# Patient Record
Sex: Female | Born: 1935 | Race: White | Hispanic: No | Smoking: Former smoker
Health system: Southern US, Community
[De-identification: ages and names within clinical notes are randomized; demographics above are authoritative.]

## PROBLEM LIST (undated history)

## (undated) DIAGNOSIS — I2510 Atherosclerotic heart disease of native coronary artery without angina pectoris: Secondary | ICD-10-CM

## (undated) DIAGNOSIS — R413 Other amnesia: Secondary | ICD-10-CM

## (undated) DIAGNOSIS — I10 Essential (primary) hypertension: Secondary | ICD-10-CM

## (undated) DIAGNOSIS — E785 Hyperlipidemia, unspecified: Secondary | ICD-10-CM

## (undated) DIAGNOSIS — J449 Chronic obstructive pulmonary disease, unspecified: Secondary | ICD-10-CM

## (undated) DIAGNOSIS — R0902 Hypoxemia: Secondary | ICD-10-CM

## (undated) DIAGNOSIS — E119 Type 2 diabetes mellitus without complications: Secondary | ICD-10-CM

## (undated) DIAGNOSIS — Z9181 History of falling: Secondary | ICD-10-CM

## (undated) HISTORY — DX: Atherosclerotic heart disease of native coronary artery without angina pectoris: I25.10

## (undated) HISTORY — PX: CATARACT EXTRACTION, BILATERAL: SHX1313

## (undated) HISTORY — DX: Type 2 diabetes mellitus without complications: E11.9

## (undated) HISTORY — PX: TONSILLECTOMY: SUR1361

## (undated) HISTORY — PX: TOTAL VAGINAL HYSTERECTOMY: SHX2548

## (undated) HISTORY — DX: Other amnesia: R41.3

## (undated) HISTORY — DX: Hypoxemia: R09.02

## (undated) HISTORY — DX: Hyperlipidemia, unspecified: E78.5

## (undated) HISTORY — DX: History of falling: Z91.81

## (undated) HISTORY — PX: IMPLANTABLE CONTACT LENS IMPLANTATION: SHX1792

## (undated) HISTORY — DX: Essential (primary) hypertension: I10

## (undated) HISTORY — PX: GALLBLADDER SURGERY: SHX652

---

## 1996-04-30 HISTORY — PX: CORONARY ARTERY BYPASS GRAFT: SHX141

## 2001-10-07 ENCOUNTER — Ambulatory Visit (HOSPITAL_COMMUNITY): Admission: RE | Admit: 2001-10-07 | Discharge: 2001-10-07 | Payer: Self-pay | Admitting: Internal Medicine

## 2001-11-17 ENCOUNTER — Encounter: Payer: Self-pay | Admitting: Surgery

## 2001-11-17 ENCOUNTER — Encounter: Admission: RE | Admit: 2001-11-17 | Discharge: 2001-11-17 | Payer: Self-pay | Admitting: Surgery

## 2002-04-10 ENCOUNTER — Encounter: Payer: Self-pay | Admitting: Internal Medicine

## 2002-04-10 ENCOUNTER — Encounter: Admission: RE | Admit: 2002-04-10 | Discharge: 2002-04-10 | Payer: Self-pay | Admitting: Internal Medicine

## 2002-04-14 ENCOUNTER — Encounter: Admission: RE | Admit: 2002-04-14 | Discharge: 2002-04-14 | Payer: Self-pay | Admitting: Internal Medicine

## 2002-04-14 ENCOUNTER — Encounter: Payer: Self-pay | Admitting: Internal Medicine

## 2004-01-27 ENCOUNTER — Ambulatory Visit (HOSPITAL_COMMUNITY): Admission: RE | Admit: 2004-01-27 | Discharge: 2004-01-27 | Payer: Self-pay

## 2004-02-28 ENCOUNTER — Ambulatory Visit: Payer: Self-pay | Admitting: Internal Medicine

## 2004-02-28 ENCOUNTER — Inpatient Hospital Stay (HOSPITAL_COMMUNITY): Admission: EM | Admit: 2004-02-28 | Discharge: 2004-03-08 | Payer: Self-pay | Admitting: Emergency Medicine

## 2004-02-28 ENCOUNTER — Ambulatory Visit: Payer: Self-pay | Admitting: Cardiology

## 2004-02-29 ENCOUNTER — Ambulatory Visit: Payer: Self-pay | Admitting: Internal Medicine

## 2004-03-03 ENCOUNTER — Ambulatory Visit: Payer: Self-pay | Admitting: Internal Medicine

## 2004-03-20 ENCOUNTER — Ambulatory Visit: Payer: Self-pay | Admitting: Internal Medicine

## 2004-04-20 ENCOUNTER — Ambulatory Visit: Payer: Self-pay | Admitting: Internal Medicine

## 2004-06-23 ENCOUNTER — Ambulatory Visit: Payer: Self-pay | Admitting: Internal Medicine

## 2004-09-13 ENCOUNTER — Ambulatory Visit: Payer: Self-pay | Admitting: Unknown Physician Specialty

## 2004-10-25 ENCOUNTER — Ambulatory Visit: Payer: Self-pay

## 2004-10-26 ENCOUNTER — Other Ambulatory Visit: Payer: Self-pay

## 2004-10-26 ENCOUNTER — Inpatient Hospital Stay: Payer: Self-pay

## 2004-10-30 ENCOUNTER — Inpatient Hospital Stay: Payer: Self-pay | Admitting: Psychiatry

## 2005-07-10 ENCOUNTER — Inpatient Hospital Stay: Payer: Self-pay | Admitting: Internal Medicine

## 2005-07-10 ENCOUNTER — Other Ambulatory Visit: Payer: Self-pay

## 2005-12-26 ENCOUNTER — Ambulatory Visit: Payer: Self-pay | Admitting: Family Medicine

## 2006-08-29 ENCOUNTER — Ambulatory Visit: Payer: Self-pay | Admitting: Unknown Physician Specialty

## 2006-09-11 ENCOUNTER — Ambulatory Visit: Payer: Self-pay | Admitting: Unknown Physician Specialty

## 2007-04-16 ENCOUNTER — Other Ambulatory Visit: Payer: Self-pay

## 2007-04-16 ENCOUNTER — Ambulatory Visit: Payer: Self-pay | Admitting: Internal Medicine

## 2007-08-27 ENCOUNTER — Ambulatory Visit: Payer: Self-pay | Admitting: Internal Medicine

## 2007-08-27 ENCOUNTER — Other Ambulatory Visit: Payer: Self-pay

## 2009-02-11 ENCOUNTER — Ambulatory Visit: Payer: Self-pay | Admitting: Internal Medicine

## 2009-02-13 ENCOUNTER — Ambulatory Visit: Payer: Self-pay | Admitting: Internal Medicine

## 2009-02-13 ENCOUNTER — Inpatient Hospital Stay: Payer: Self-pay | Admitting: Internal Medicine

## 2009-02-16 HISTORY — PX: CARDIAC CATHETERIZATION: SHX172

## 2009-02-18 ENCOUNTER — Encounter: Payer: Self-pay | Admitting: Internal Medicine

## 2009-02-28 ENCOUNTER — Encounter: Payer: Self-pay | Admitting: Internal Medicine

## 2009-03-11 ENCOUNTER — Emergency Department: Payer: Self-pay | Admitting: Emergency Medicine

## 2009-03-12 ENCOUNTER — Inpatient Hospital Stay: Payer: Self-pay | Admitting: Internal Medicine

## 2009-03-16 ENCOUNTER — Ambulatory Visit: Payer: Self-pay | Admitting: Internal Medicine

## 2009-03-17 ENCOUNTER — Ambulatory Visit: Payer: Self-pay | Admitting: Family Medicine

## 2009-05-02 ENCOUNTER — Emergency Department: Payer: Self-pay | Admitting: Emergency Medicine

## 2009-05-16 ENCOUNTER — Emergency Department: Payer: Self-pay | Admitting: Internal Medicine

## 2009-09-05 ENCOUNTER — Ambulatory Visit: Payer: Self-pay | Admitting: Family Medicine

## 2009-09-20 ENCOUNTER — Emergency Department: Payer: Self-pay | Admitting: Emergency Medicine

## 2009-10-23 ENCOUNTER — Ambulatory Visit: Payer: Self-pay | Admitting: Internal Medicine

## 2010-08-19 ENCOUNTER — Emergency Department: Payer: Self-pay | Admitting: Emergency Medicine

## 2011-01-14 IMAGING — CR DG KNEE COMPLETE 4+V*R*
1 series · 5 of 5 positions shown · non-contrast
Comparison: none

REASON FOR EXAM: sprain of knee
COMMENTS:

PROCEDURE:     MDR - MDR KNEE RT COMPLETE W/OBLIQUES  - September 05, 2009  [DATE]
RESULT:     No fracture about the knee joint is seen. The patella is intact.
There is an old healed fracture of the proximal fibular diaphysis.

[Series 1: view not recorded · 0.17mm/px · 5 of 5 slices shown]
[im 1/5]
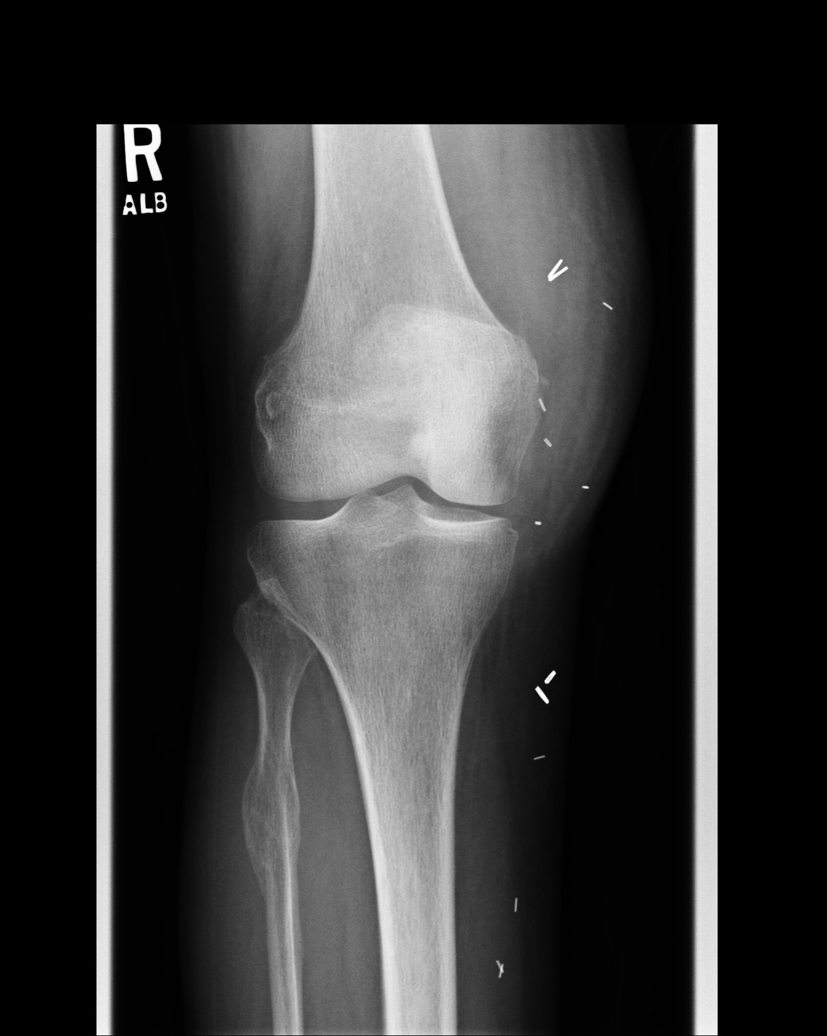
[im 2/5]
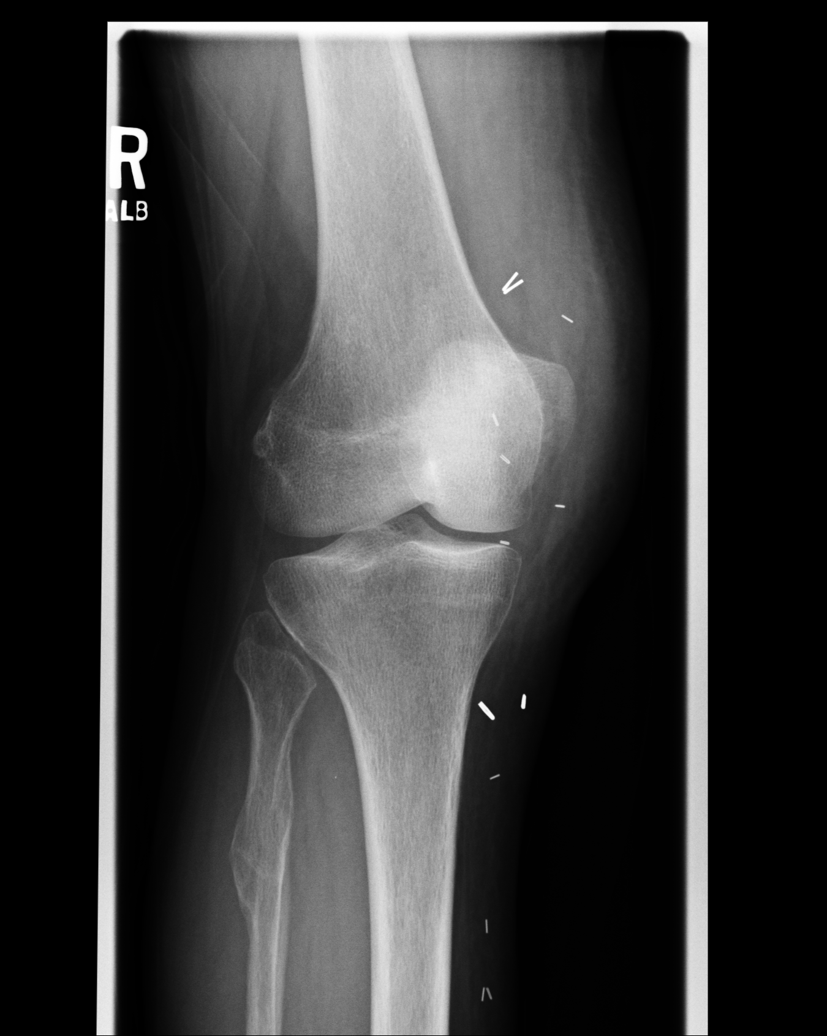
[im 3/5]
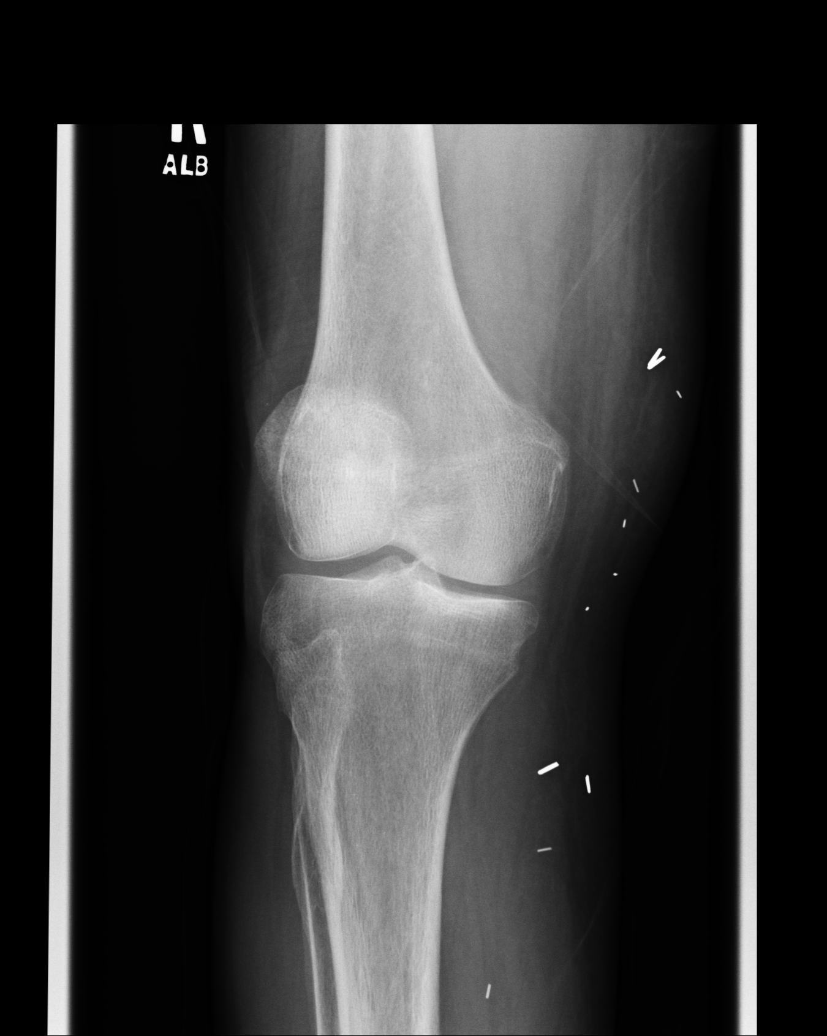
[im 4/5]
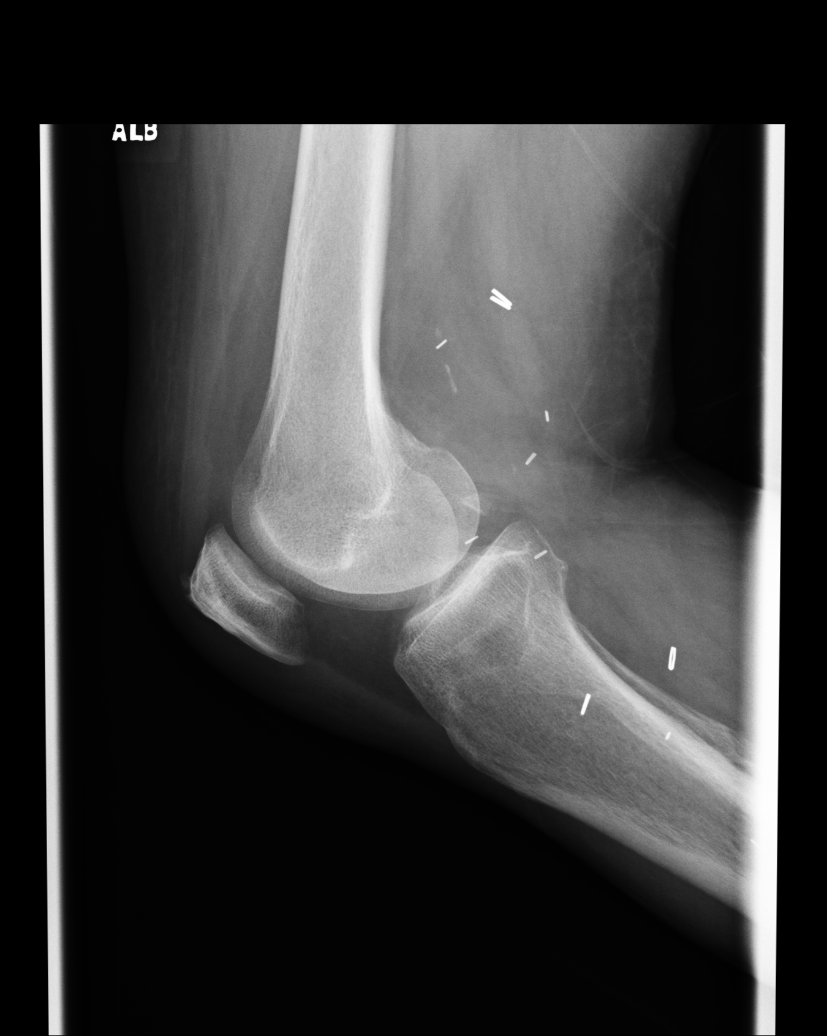
[im 5/5]
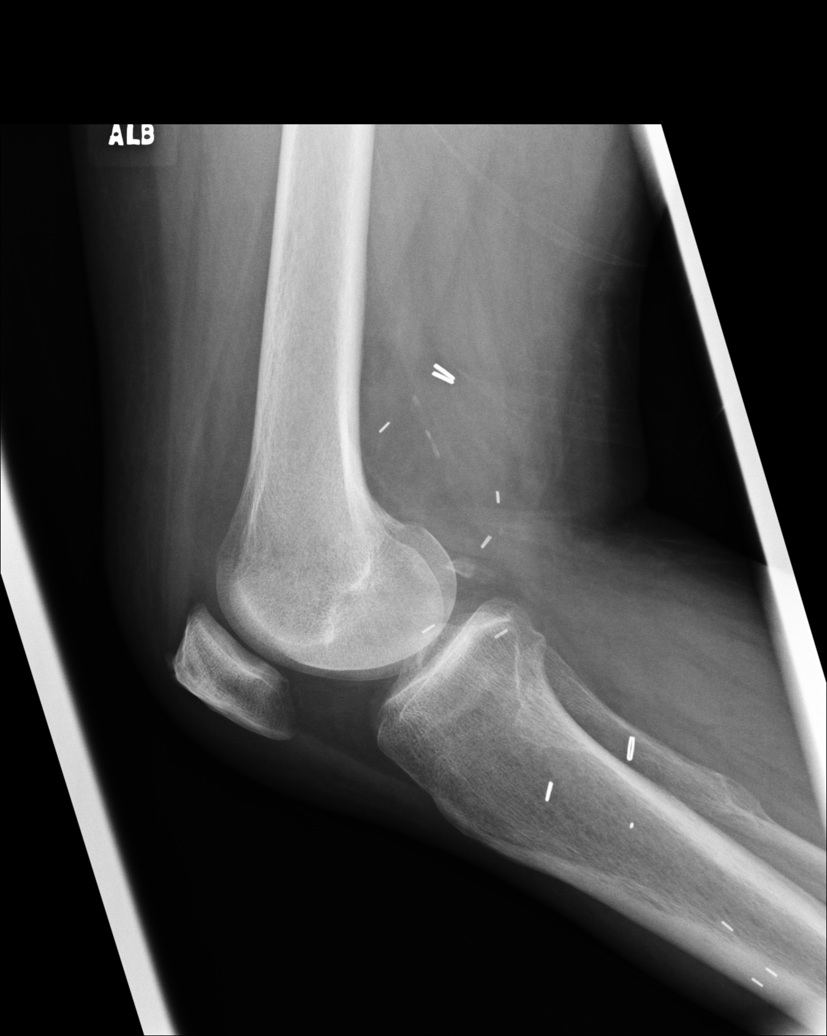

[5 of 5 positions shown; findings below may reference images not displayed]

IMPRESSION: 1. No acute bony abnormalities are seen.
2. There is an old fracture of the proximal fibular diaphysis.

## 2011-06-04 ENCOUNTER — Ambulatory Visit: Payer: Self-pay

## 2011-09-03 ENCOUNTER — Ambulatory Visit: Payer: Self-pay | Admitting: Family Medicine

## 2011-10-03 ENCOUNTER — Observation Stay: Payer: Self-pay | Admitting: Internal Medicine

## 2011-10-03 LAB — URINALYSIS, COMPLETE
Bacteria: NONE SEEN
Blood: NEGATIVE
Leukocyte Esterase: NEGATIVE
Nitrite: NEGATIVE
Ph: 5 (ref 4.5–8.0)
Protein: NEGATIVE
RBC,UR: 1 /HPF (ref 0–5)
Specific Gravity: 1.025 (ref 1.003–1.030)
Squamous Epithelial: 1
WBC UR: 5 /HPF (ref 0–5)

## 2011-10-03 LAB — COMPREHENSIVE METABOLIC PANEL
Albumin: 3.6 g/dL (ref 3.4–5.0)
Alkaline Phosphatase: 85 U/L (ref 50–136)
BUN: 23 mg/dL — ABNORMAL HIGH (ref 7–18)
Bilirubin,Total: 0.4 mg/dL (ref 0.2–1.0)
Chloride: 107 mmol/L (ref 98–107)
Co2: 27 mmol/L (ref 21–32)
Creatinine: 0.74 mg/dL (ref 0.60–1.30)
EGFR (African American): >60
EGFR (Non-African Amer.): >60
Glucose: 237 mg/dL — ABNORMAL HIGH (ref 65–99)
Osmolality: 295 (ref 275–301)
Potassium: 4.4 mmol/L (ref 3.5–5.1)
Sodium: 142 mmol/L (ref 136–145)

## 2011-10-03 LAB — CBC
HCT: 44.7 % (ref 35.0–47.0)
HGB: 14.9 g/dL (ref 12.0–16.0)
MCH: 30.1 pg (ref 26.0–34.0)
MCHC: 33.5 g/dL (ref 32.0–36.0)
MCV: 90 fL (ref 80–100)
Platelet: 126 10*3/uL — ABNORMAL LOW (ref 150–440)
RBC: 4.96 10*6/uL (ref 3.80–5.20)
RDW: 14.1 % (ref 11.5–14.5)
WBC: 7.5 10*3/uL (ref 3.6–11.0)

## 2011-10-03 LAB — TSH: Thyroid Stimulating Horm: 0.692 u[IU]/mL

## 2011-10-04 DIAGNOSIS — I359 Nonrheumatic aortic valve disorder, unspecified: Secondary | ICD-10-CM

## 2011-10-04 LAB — CBC WITH DIFFERENTIAL/PLATELET
Basophil #: 0 10*3/uL (ref 0.0–0.1)
Basophil %: 0.5 %
Eosinophil #: 0.1 10*3/uL (ref 0.0–0.7)
Eosinophil %: 1.1 %
HCT: 44.4 % (ref 35.0–47.0)
HGB: 14.7 g/dL (ref 12.0–16.0)
Lymphocyte #: 3.5 10*3/uL (ref 1.0–3.6)
Lymphocyte %: 49.9 %
MCH: 29.5 pg (ref 26.0–34.0)
MCHC: 33.1 g/dL (ref 32.0–36.0)
MCV: 89 fL (ref 80–100)
Monocyte #: 0.5 x10 3/mm (ref 0.2–0.9)
Monocyte %: 6.4 %
Neutrophil #: 3 10*3/uL (ref 1.4–6.5)
Neutrophil %: 42.1 %
Platelet: 127 10*3/uL — ABNORMAL LOW (ref 150–440)
RBC: 4.97 10*6/uL (ref 3.80–5.20)
RDW: 14 % (ref 11.5–14.5)
WBC: 7 10*3/uL (ref 3.6–11.0)

## 2011-10-04 LAB — BASIC METABOLIC PANEL
Anion Gap: 8 (ref 7–16)
BUN: 17 mg/dL (ref 7–18)
Calcium, Total: 9.1 mg/dL (ref 8.5–10.1)
Chloride: 108 mmol/L — ABNORMAL HIGH (ref 98–107)
Co2: 26 mmol/L (ref 21–32)
Creatinine: 0.49 mg/dL — ABNORMAL LOW (ref 0.60–1.30)
EGFR (African American): >60
EGFR (Non-African Amer.): >60
Glucose: 184 mg/dL — ABNORMAL HIGH (ref 65–99)
Osmolality: 289 (ref 275–301)
Potassium: 3.9 mmol/L (ref 3.5–5.1)
Sodium: 142 mmol/L (ref 136–145)

## 2011-10-04 LAB — LIPID PANEL
Cholesterol: 150 mg/dL (ref 0–200)
HDL Cholesterol: 32 mg/dL — ABNORMAL LOW (ref 40–60)
Ldl Cholesterol, Calc: 52 mg/dL (ref 0–100)
Triglycerides: 331 mg/dL — ABNORMAL HIGH (ref 0–200)
VLDL Cholesterol, Calc: 66 mg/dL — ABNORMAL HIGH (ref 5–40)

## 2011-10-04 LAB — HEMOGLOBIN A1C: Hemoglobin A1C: 10 % — ABNORMAL HIGH (ref 4.2–6.3)

## 2011-10-24 ENCOUNTER — Ambulatory Visit: Payer: Self-pay | Admitting: Unknown Physician Specialty

## 2011-11-25 ENCOUNTER — Ambulatory Visit: Payer: Self-pay | Admitting: Internal Medicine

## 2012-10-30 ENCOUNTER — Emergency Department: Payer: Self-pay

## 2012-10-30 LAB — CBC WITH DIFFERENTIAL/PLATELET
Basophil #: 0.1 10*3/uL (ref 0.0–0.1)
Basophil %: 0.8 %
Eosinophil #: 0.1 10*3/uL (ref 0.0–0.7)
Eosinophil %: 1 %
HCT: 44.3 % (ref 35.0–47.0)
HGB: 14.9 g/dL (ref 12.0–16.0)
Lymphocyte #: 3.3 10*3/uL (ref 1.0–3.6)
Lymphocyte %: 36.6 %
MCH: 29.5 pg (ref 26.0–34.0)
MCHC: 33.6 g/dL (ref 32.0–36.0)
MCV: 88 fL (ref 80–100)
Monocyte #: 0.5 x10 3/mm (ref 0.2–0.9)
Monocyte %: 5.9 %
Neutrophil #: 5 10*3/uL (ref 1.4–6.5)
Neutrophil %: 55.7 %
Platelet: 175 10*3/uL (ref 150–440)
RBC: 5.04 10*6/uL (ref 3.80–5.20)
RDW: 15.7 % — ABNORMAL HIGH (ref 11.5–14.5)
WBC: 8.9 10*3/uL (ref 3.6–11.0)

## 2012-10-30 LAB — BASIC METABOLIC PANEL
Anion Gap: 5 — ABNORMAL LOW (ref 7–16)
BUN: 15 mg/dL (ref 7–18)
Calcium, Total: 10.2 mg/dL — ABNORMAL HIGH (ref 8.5–10.1)
Chloride: 108 mmol/L — ABNORMAL HIGH (ref 98–107)
Co2: 30 mmol/L (ref 21–32)
Creatinine: 0.65 mg/dL (ref 0.60–1.30)
EGFR (African American): >60
EGFR (Non-African Amer.): >60
Glucose: 72 mg/dL (ref 65–99)
Osmolality: 284 (ref 275–301)
Potassium: 3.9 mmol/L (ref 3.5–5.1)
Sodium: 143 mmol/L (ref 136–145)

## 2013-01-16 ENCOUNTER — Ambulatory Visit: Payer: Self-pay | Admitting: Internal Medicine

## 2013-01-16 LAB — URINALYSIS, COMPLETE
Blood: NEGATIVE
Glucose,UR: 100 mg/dL (ref 0–75)
Ketone: NEGATIVE
Nitrite: POSITIVE
Ph: 6 (ref 4.5–8.0)
Specific Gravity: 1.02 (ref 1.003–1.030)

## 2013-01-18 ENCOUNTER — Ambulatory Visit: Payer: Self-pay | Admitting: Family Medicine

## 2013-01-18 LAB — URINE CULTURE

## 2013-05-16 ENCOUNTER — Emergency Department: Payer: Self-pay | Admitting: Emergency Medicine

## 2013-05-18 ENCOUNTER — Ambulatory Visit: Payer: Self-pay | Admitting: Family Medicine

## 2013-05-18 LAB — COMPREHENSIVE METABOLIC PANEL
Albumin: 3.6 g/dL (ref 3.4–5.0)
Alkaline Phosphatase: 81 U/L
Anion Gap: 11 (ref 7–16)
BUN: 14 mg/dL (ref 7–18)
Bilirubin,Total: 0.3 mg/dL (ref 0.2–1.0)
Calcium, Total: 9.7 mg/dL (ref 8.5–10.1)
Chloride: 100 mmol/L (ref 98–107)
Co2: 30 mmol/L (ref 21–32)
Creatinine: 0.71 mg/dL (ref 0.60–1.30)
EGFR (African American): >60
EGFR (Non-African Amer.): >60
Glucose: 169 mg/dL — ABNORMAL HIGH (ref 65–99)
Osmolality: 286 (ref 275–301)
Potassium: 3.6 mmol/L (ref 3.5–5.1)
SGOT(AST): 13 U/L — ABNORMAL LOW (ref 15–37)
SGPT (ALT): 20 U/L (ref 12–78)
Sodium: 141 mmol/L (ref 136–145)
Total Protein: 6.3 g/dL — ABNORMAL LOW (ref 6.4–8.2)

## 2013-05-18 LAB — CBC WITH DIFFERENTIAL/PLATELET
Basophil #: 0.1 10*3/uL (ref 0.0–0.1)
Basophil %: 0.8 %
Eosinophil #: 0.1 10*3/uL (ref 0.0–0.7)
Eosinophil %: 1 %
HCT: 41.8 % (ref 35.0–47.0)
HGB: 13.4 g/dL (ref 12.0–16.0)
Lymphocyte #: 2.6 10*3/uL (ref 1.0–3.6)
Lymphocyte %: 29.5 %
MCH: 29.1 pg (ref 26.0–34.0)
MCHC: 32 g/dL (ref 32.0–36.0)
MCV: 91 fL (ref 80–100)
Monocyte #: 0.6 x10 3/mm (ref 0.2–0.9)
Monocyte %: 6.4 %
Neutrophil #: 5.5 10*3/uL (ref 1.4–6.5)
Neutrophil %: 62.3 %
Platelet: 153 10*3/uL (ref 150–440)
RBC: 4.6 10*6/uL (ref 3.80–5.20)
RDW: 14.4 % (ref 11.5–14.5)
WBC: 8.9 10*3/uL (ref 3.6–11.0)

## 2013-05-19 ENCOUNTER — Ambulatory Visit: Payer: Self-pay | Admitting: Family Medicine

## 2013-05-19 LAB — URINALYSIS, COMPLETE
Blood: NEGATIVE
Glucose,UR: 100 mg/dL (ref 0–75)
Ketone: NEGATIVE
Leukocyte Esterase: NEGATIVE
Nitrite: POSITIVE
Ph: 6 (ref 4.5–8.0)
RBC,UR: NONE SEEN /HPF (ref 0–5)
Specific Gravity: 1.025 (ref 1.003–1.030)

## 2013-05-21 ENCOUNTER — Ambulatory Visit: Payer: Self-pay

## 2013-05-21 LAB — URINALYSIS, COMPLETE
Blood: NEGATIVE
GLUCOSE, UR: NEGATIVE mg/dL (ref 0–75)
Ketone: NEGATIVE
NITRITE: NEGATIVE
Ph: 6 (ref 4.5–8.0)
Specific Gravity: >=1.03 (ref 1.003–1.030)

## 2013-05-22 LAB — URINE CULTURE

## 2013-06-11 ENCOUNTER — Encounter: Payer: Self-pay | Admitting: Cardiovascular Disease

## 2013-06-11 ENCOUNTER — Ambulatory Visit (INDEPENDENT_AMBULATORY_CARE_PROVIDER_SITE_OTHER): Payer: Medicare Other | Admitting: Cardiovascular Disease

## 2013-06-11 VITALS — BP 100/52 | HR 77 | Ht 60.0 in | Wt 130.0 lb

## 2013-06-11 DIAGNOSIS — F17200 Nicotine dependence, unspecified, uncomplicated: Secondary | ICD-10-CM

## 2013-06-11 DIAGNOSIS — J438 Other emphysema: Secondary | ICD-10-CM

## 2013-06-11 DIAGNOSIS — M1990 Unspecified osteoarthritis, unspecified site: Secondary | ICD-10-CM

## 2013-06-11 DIAGNOSIS — R296 Repeated falls: Secondary | ICD-10-CM | POA: Insufficient documentation

## 2013-06-11 DIAGNOSIS — I498 Other specified cardiac arrhythmias: Secondary | ICD-10-CM

## 2013-06-11 DIAGNOSIS — E119 Type 2 diabetes mellitus without complications: Secondary | ICD-10-CM | POA: Insufficient documentation

## 2013-06-11 DIAGNOSIS — R55 Syncope and collapse: Secondary | ICD-10-CM | POA: Insufficient documentation

## 2013-06-11 DIAGNOSIS — I25810 Atherosclerosis of coronary artery bypass graft(s) without angina pectoris: Secondary | ICD-10-CM | POA: Insufficient documentation

## 2013-06-11 DIAGNOSIS — W19XXXA Unspecified fall, initial encounter: Secondary | ICD-10-CM | POA: Insufficient documentation

## 2013-06-11 DIAGNOSIS — J439 Emphysema, unspecified: Secondary | ICD-10-CM | POA: Insufficient documentation

## 2013-06-11 DIAGNOSIS — R001 Bradycardia, unspecified: Secondary | ICD-10-CM | POA: Insufficient documentation

## 2013-06-11 NOTE — Progress Notes (Signed)
Patient ID: Kimberly Rodgers, female    DOB: January 16, 1935, 79 y.o.   MRN: [id number]  HPI Comments: Kimberly Rodgers is a 79 year old woman with history of CAD, bypass surgery x3 in 1998, diabetes, long history of smoking who continues to smoke, cardiac catheterization in October 2010 showing patent grafts with LIMA to the LAD, vein graft to the PDA, vein graft to the OM, with history of syncope. She presents for new patient evaluation for syncope, frequent falls, pass out spells.  Her 2 daughters present with her today. They know her history relatively well. They report a remote history of seizures. They deny any recent seizure activity. No recent cardiac decompensation, no symptoms of angina. Their biggest concern is periods of syncope (" blacking out") that seemed to occur while walking. She has had frequent falls from gait instability. Previously required staples to her head for lacerations.. most recent episode of syncope was in the middle of January 2015, witnessed by her daughter. She was walking to the back of the house, up on a porch when she acutely passed out and hit her head. She had a short recovery time. Patient does not remember the event.  Previous events she does not remember as well. Sometimes she has taken several minutes to recover. Recent blood pressure measurements with primary-care have shown orthostasis with systolic pressures of 100 with standing. Also documentation of low heart rates with standing with heart rate in the high 40s. Typically heart rate is in the 60s to 70s it would appear by the office notes.  Hemoglobin A1c down from 6.5-5.8. She has been losing weight, not drinking much, not eating much. Does report recent urinary tract infection which was treated. She had confusion during this time. They report UTIs better her confusion has persisted to a mild degree. She has seen neurology for confusion, started on Aricept. She is essentially wheelchair-bound though does walk with  assistance In the past she has refused oxygen. Documentation of frequent periods of hypoxia with sats into the low 80s. She has been more compliant with her oxygen recently. Previous falls also be associated with low oxygen levels she was wearing oxygen for recent syncopal episode  Prior echocardiogram October 2010 with ejection fraction 45%. LVH otherwise normal study Carotid ultrasound June 2013 with mild carotid disease  EKG shows normal sinus rhythm with rate 77 beats per minute, no significant ST or T wave changes   Outpatient Encounter Prescriptions as of 06/11/2013  Medication Sig  . aspirin 81 MG tablet Take 81 mg by mouth daily.  Marland Kitchen atorvastatin (LIPITOR) 40 MG tablet Take 40 mg by mouth daily at 6 PM.   . donepezil (ARICEPT) 5 MG tablet Take 5 mg by mouth at bedtime.   . furosemide (LASIX) 20 MG tablet Take 20 mg by mouth daily as needed.   Marland Kitchen glipiZIDE (GLUCOTROL) 5 MG tablet Take 5 mg by mouth 2 (two) times daily before a meal.   . metFORMIN (GLUCOPHAGE) 1000 MG tablet Take 1,000 mg by mouth 2 (two) times daily with a meal.   . Naproxen Sodium (ALEVE) 220 MG CAPS Take 220 mg by mouth as needed.  . traZODone (DESYREL) 50 MG tablet Take 100 mg by mouth at bedtime.   Marland Kitchen venlafaxine XR (EFFEXOR-XR) 37.5 MG 24 hr capsule Take 37.5 mg by mouth daily with breakfast.     Review of Systems  Constitutional: Negative.   HENT: Negative.   Eyes: Negative.   Respiratory: Positive for shortness of breath.  Cardiovascular: Negative.   Gastrointestinal: Negative.   Endocrine: Negative.   Musculoskeletal: Positive for gait problem.  Skin: Negative.   Allergic/Immunologic: Negative.   Neurological: Positive for syncope.  Hematological: Negative.   Psychiatric/Behavioral: Positive for confusion.  All other systems reviewed and are negative.    BP 100/52  Pulse 77  Ht 5' (1.524 m)  Wt 130 lb (58.968 kg)  BMI 25.39 kg/m2  Physical Exam  Nursing note and vitals  reviewed. Constitutional: She is oriented to person, place, and time. She appears well-developed and well-nourished.  HENT:  Head: Normocephalic.  Nose: Nose normal.  Mouth/Throat: Oropharynx is clear and moist.  Eyes: Conjunctivae are normal. Pupils are equal, round, and reactive to light.  Neck: Normal range of motion. Neck supple. No JVD present.  Cardiovascular: Normal rate, regular rhythm, S1 normal, S2 normal, normal heart sounds and intact distal pulses.  Exam reveals no gallop and no friction rub.   No murmur heard. Pulmonary/Chest: Effort normal and breath sounds normal. No respiratory distress. She has no wheezes. She has no rales. She exhibits no tenderness.  Abdominal: Soft. Bowel sounds are normal. She exhibits no distension. There is no tenderness.  Musculoskeletal: Normal range of motion. She exhibits no edema and no tenderness.  Lymphadenopathy:    She has no cervical adenopathy.  Neurological: She is alert and oriented to person, place, and time. Coordination normal.  Skin: Skin is warm and dry. No rash noted. No erythema.  Psychiatric: She has a normal mood and affect. Her behavior is normal. Judgment and thought content normal.    Assessment and Plan

## 2013-06-11 NOTE — Assessment & Plan Note (Signed)
She has severe COPD, on oxygen with hypoxia on room air

## 2013-06-11 NOTE — Assessment & Plan Note (Signed)
Recent weight loss. Hemoglobin A1c well controlled

## 2013-06-11 NOTE — Assessment & Plan Note (Signed)
Etiology of her syncopal episodes and they're concerning for arrhythmia. They have been very sporadically, not on a regular basis. As they are not frequent, we will order a 30 day monitor. Severe bradycardia and recorded in the notes from primary care with heart rate in the high 40s.  Unable to exclude orthostatic hypotension. We will hold amlodipine

## 2013-06-11 NOTE — Assessment & Plan Note (Signed)
Very unsteady gait. Family reports physical therapy has been ordered.

## 2013-06-11 NOTE — Patient Instructions (Addendum)
Please stop the amlodipine.  Blood pressure is low  Use voltaren cream periodically (twice a day) for knee pain We will order an event monitor for syncope, falls  Please call us if you have new issues that need to be addressed before your next appt.

## 2013-06-11 NOTE — Assessment & Plan Note (Signed)
She reports that she smokes 3 cigarettes per day, down from 2 or 3 packs per day

## 2013-06-11 NOTE — Assessment & Plan Note (Signed)
Currently with no symptoms of angina. No further workup at this time. Continue current medication regimen. 

## 2013-06-17 DIAGNOSIS — R55 Syncope and collapse: Secondary | ICD-10-CM

## 2013-08-01 ENCOUNTER — Observation Stay: Payer: Self-pay | Admitting: Internal Medicine

## 2013-08-01 LAB — COMPREHENSIVE METABOLIC PANEL
Albumin: 3.3 g/dL — ABNORMAL LOW (ref 3.4–5.0)
Alkaline Phosphatase: 66 U/L
Anion Gap: 3 — ABNORMAL LOW (ref 7–16)
BUN: 13 mg/dL (ref 7–18)
Bilirubin,Total: 0.3 mg/dL (ref 0.2–1.0)
Calcium, Total: 9.1 mg/dL (ref 8.5–10.1)
Chloride: 106 mmol/L (ref 98–107)
Co2: 31 mmol/L (ref 21–32)
Creatinine: 0.64 mg/dL (ref 0.60–1.30)
EGFR (Non-African Amer.): >60
Glucose: 148 mg/dL — ABNORMAL HIGH (ref 65–99)
Osmolality: 282 (ref 275–301)
Potassium: 3.6 mmol/L (ref 3.5–5.1)
SGOT(AST): 23 U/L (ref 15–37)
SGPT (ALT): 24 U/L (ref 12–78)
Sodium: 140 mmol/L (ref 136–145)
Total Protein: 6.2 g/dL — ABNORMAL LOW (ref 6.4–8.2)

## 2013-08-01 LAB — URINALYSIS, COMPLETE
BACTERIA: NONE SEEN
Bilirubin,UR: NEGATIVE
Glucose,UR: 150 mg/dL (ref 0–75)
Ketone: NEGATIVE
Leukocyte Esterase: NEGATIVE
Nitrite: NEGATIVE
Ph: 6 (ref 4.5–8.0)
Protein: NEGATIVE
RBC,UR: NONE SEEN /HPF (ref 0–5)
Specific Gravity: 1.008 (ref 1.003–1.030)
Squamous Epithelial: <1
WBC UR: <1 /HPF (ref 0–5)

## 2013-08-01 LAB — CBC
HCT: 37.3 % (ref 35.0–47.0)
HGB: 12.1 g/dL (ref 12.0–16.0)
MCH: 29 pg (ref 26.0–34.0)
MCHC: 32.4 g/dL (ref 32.0–36.0)
MCV: 90 fL (ref 80–100)
Platelet: 151 10*3/uL (ref 150–440)
RBC: 4.16 10*6/uL (ref 3.80–5.20)
RDW: 14.2 % (ref 11.5–14.5)
WBC: 8 10*3/uL (ref 3.6–11.0)

## 2013-08-01 LAB — TROPONIN I
TROPONIN-I: 0.06 ng/mL — AB
TROPONIN-I: 0.04 ng/mL
Troponin-I: 0.04 ng/mL

## 2013-08-07 ENCOUNTER — Telehealth: Payer: Self-pay

## 2013-08-07 NOTE — Telephone Encounter (Signed)
Left message for pt to call back regarding holter monitor results: "NSR w/ rare APCs".

## 2013-08-07 NOTE — Telephone Encounter (Signed)
Reviewed results w/ pt's daughter, Lupita LeashDonna.

## 2013-08-10 ENCOUNTER — Other Ambulatory Visit: Payer: Self-pay

## 2013-08-10 ENCOUNTER — Ambulatory Visit (INDEPENDENT_AMBULATORY_CARE_PROVIDER_SITE_OTHER): Payer: Medicare Other

## 2013-08-10 DIAGNOSIS — W19XXXA Unspecified fall, initial encounter: Secondary | ICD-10-CM

## 2013-08-10 DIAGNOSIS — R001 Bradycardia, unspecified: Secondary | ICD-10-CM

## 2013-08-10 DIAGNOSIS — R55 Syncope and collapse: Secondary | ICD-10-CM

## 2013-08-10 DIAGNOSIS — I25810 Atherosclerosis of coronary artery bypass graft(s) without angina pectoris: Secondary | ICD-10-CM

## 2013-08-17 ENCOUNTER — Emergency Department: Payer: Self-pay | Admitting: Emergency Medicine

## 2013-08-17 LAB — CBC WITH DIFFERENTIAL/PLATELET
Basophil #: 0 10*3/uL (ref 0.0–0.1)
Basophil %: 0.4 %
Eosinophil #: 0.1 10*3/uL (ref 0.0–0.7)
Eosinophil %: 0.9 %
HCT: 41.3 % (ref 35.0–47.0)
HGB: 13.3 g/dL (ref 12.0–16.0)
Lymphocyte #: 2.6 10*3/uL (ref 1.0–3.6)
Lymphocyte %: 31.5 %
MCH: 28.6 pg (ref 26.0–34.0)
MCHC: 32.2 g/dL (ref 32.0–36.0)
MCV: 89 fL (ref 80–100)
Monocyte #: 0.6 x10 3/mm (ref 0.2–0.9)
Monocyte %: 7.4 %
Neutrophil #: 4.9 10*3/uL (ref 1.4–6.5)
Neutrophil %: 59.8 %
Platelet: 179 10*3/uL (ref 150–440)
RBC: 4.66 10*6/uL (ref 3.80–5.20)
RDW: 14.8 % — ABNORMAL HIGH (ref 11.5–14.5)
WBC: 8.3 10*3/uL (ref 3.6–11.0)

## 2013-08-17 LAB — BASIC METABOLIC PANEL
Anion Gap: 6 — ABNORMAL LOW (ref 7–16)
BUN: 15 mg/dL (ref 7–18)
CHLORIDE: 106 mmol/L (ref 98–107)
CO2: 28 mmol/L (ref 21–32)
Calcium, Total: 9.8 mg/dL (ref 8.5–10.1)
Creatinine: 0.55 mg/dL — ABNORMAL LOW (ref 0.60–1.30)
EGFR (African American): >60
EGFR (Non-African Amer.): >60
Glucose: 93 mg/dL (ref 65–99)
Osmolality: 280 (ref 275–301)
POTASSIUM: 3.6 mmol/L (ref 3.5–5.1)
Sodium: 140 mmol/L (ref 136–145)

## 2013-08-17 LAB — TROPONIN I: Troponin-I: <0.02 ng/mL

## 2013-08-18 LAB — URINALYSIS, COMPLETE
Glucose,UR: NEGATIVE mg/dL (ref 0–75)
Nitrite: NEGATIVE
Ph: 5 (ref 4.5–8.0)
Protein: 30
RBC,UR: 184 /HPF (ref 0–5)
Specific Gravity: 1.03 (ref 1.003–1.030)
Squamous Epithelial: NONE SEEN
WBC UR: 457 /HPF (ref 0–5)

## 2013-09-18 ENCOUNTER — Emergency Department: Payer: Self-pay | Admitting: Emergency Medicine

## 2013-09-18 LAB — HEPATIC FUNCTION PANEL A (ARMC)
Albumin: 3.5 g/dL (ref 3.4–5.0)
Alkaline Phosphatase: 96 U/L
Bilirubin, Direct: 0.2 mg/dL (ref 0.00–0.20)
Bilirubin,Total: 0.6 mg/dL (ref 0.2–1.0)
SGOT(AST): 34 U/L (ref 15–37)
SGPT (ALT): 23 U/L (ref 12–78)
TOTAL PROTEIN: 7.3 g/dL (ref 6.4–8.2)

## 2013-09-18 LAB — BASIC METABOLIC PANEL
Anion Gap: 8 (ref 7–16)
BUN: 26 mg/dL — ABNORMAL HIGH (ref 7–18)
CHLORIDE: 103 mmol/L (ref 98–107)
Calcium, Total: 10.6 mg/dL — ABNORMAL HIGH (ref 8.5–10.1)
Co2: 27 mmol/L (ref 21–32)
Creatinine: 0.55 mg/dL — ABNORMAL LOW (ref 0.60–1.30)
EGFR (African American): >60
Glucose: 129 mg/dL — ABNORMAL HIGH (ref 65–99)
OSMOLALITY: 282 (ref 275–301)
POTASSIUM: 3.7 mmol/L (ref 3.5–5.1)
SODIUM: 138 mmol/L (ref 136–145)

## 2013-09-18 LAB — CBC WITH DIFFERENTIAL/PLATELET
Basophil #: 0.1 10*3/uL (ref 0.0–0.1)
Basophil %: 0.8 %
EOS ABS: 0.1 10*3/uL (ref 0.0–0.7)
Eosinophil %: 1 %
HCT: 45.3 % (ref 35.0–47.0)
HGB: 14.2 g/dL (ref 12.0–16.0)
LYMPHS ABS: 3.1 10*3/uL (ref 1.0–3.6)
LYMPHS PCT: 35 %
MCH: 28.3 pg (ref 26.0–34.0)
MCHC: 31.5 g/dL — ABNORMAL LOW (ref 32.0–36.0)
MCV: 90 fL (ref 80–100)
Monocyte #: 0.6 x10 3/mm (ref 0.2–0.9)
Monocyte %: 7.3 %
Neutrophil #: 4.9 10*3/uL (ref 1.4–6.5)
Neutrophil %: 55.9 %
Platelet: 172 10*3/uL (ref 150–440)
RBC: 5.03 10*6/uL (ref 3.80–5.20)
RDW: 14.1 % (ref 11.5–14.5)
WBC: 8.8 10*3/uL (ref 3.6–11.0)

## 2013-09-18 LAB — URINALYSIS, COMPLETE
BILIRUBIN, UR: NEGATIVE
BLOOD: NEGATIVE
Bacteria: NONE SEEN
Glucose,UR: NEGATIVE mg/dL (ref 0–75)
Leukocyte Esterase: NEGATIVE
Nitrite: NEGATIVE
Ph: 5 (ref 4.5–8.0)
Protein: NEGATIVE
RBC,UR: 2 /HPF (ref 0–5)
SPECIFIC GRAVITY: 1.027 (ref 1.003–1.030)
SQUAMOUS EPITHELIAL: NONE SEEN

## 2013-09-18 LAB — TROPONIN I

## 2013-09-18 LAB — TSH: Thyroid Stimulating Horm: 0.463 u[IU]/mL

## 2014-08-21 NOTE — H&P (Signed)
PATIENT NAME:  Kimberly Rodgers, HALBERG MR#:  [id number] DATE OF BIRTH:  05-21-1934  DATE OF ADMISSION:  08/01/2013  PRIMARY CARE PHYSICIAN: Dr. Rolm Gala.   CHIEF COMPLAINT: Weakness, lethargy and altered mental status.   HISTORY OF PRESENT ILLNESS: This is a 80 year old female who lives with her daughter, was brought into the hospital as her daughter noticed that she was more lethargic and altered as usual. The patient's daughter checked her mother's blood sugar just last night and it was low, in the 40s. She gave her mother some juice. Her blood sugar did come up. This Kimberly Rodgers when her mother was more lethargic and weak, she checked her blood sugar again and it was in the 40s again. The patient, as per the daughter, has not had episodes of hypoglycemia in the past. She has had no acute changes to her diabetic meds made recently. Her appetite has been fair. Because of the hypoglycemic being persistent, she brought the patient to the ER for further evaluation. The patient here, the blood sugars have been stable, although she was also noted to have incidentally elevated troponin at 0.06. The patient actually denied any chest pain, shortness of breath or any other acute symptoms. Hospitalist services were contacted for further treatment and evaluation.   REVIEW OF SYSTEMS:  CONSTITUTIONAL: No documented fever. No weight gain. No weight loss.  EYES: No blurry or double vision.  ENT: No tinnitus. No postnasal drip. No redness of the oropharynx. RESPIRATORY: No cough. No wheeze. No hemoptysis. No dyspnea.  CARDIOVASCULAR: No chest pain. No orthopnea. No palpitations or syncope.  GASTROINTESTINAL: No nausea. No vomiting. No diarrhea.  No abdominal pain. No melena or hematochezia. GENITOURINARY:  No dysuria or hematuria.  ENDOCRINE: No polyuria or nocturia, heat or cold intolerance.  HEMATOLOGIC: No anemia. No bruising. No bleeding.  INTEGUMENTARY: No rashes. No lesions.  MUSCULOSKELETAL: No arthritis. No  swelling. No gout.  NEUROLOGIC: No numbness or tingling. No ataxia. No seizure-type seizure. PSYCHIATRIC: No anxiety. No insomnia. No ADD. Positive dementia and Parkinson's.   ALLERGIES: ASPIRIN, CLINDAMYCIN, CODEINE, DOXYCYCLINE, PENICILLIN AND SULFA DRUGS.   SOCIAL HISTORY: Does have a long history of tobacco abuse, about 40 to 50 years. No alcohol abuse. No illicit drug abuse. Lives at home with her daughter.   FAMILY HISTORY: Both mother and father are deceased. Mother died from cancer in the kidneys. Father died from lung cancer and alcohol abuse.   CURRENT MEDICATIONS: As follows: Aleve 220 mg daily, aspirin 81 mg daily, atorvastatin 40 mg daily, Aricept 5 mg at bedtime, glipizide XL 5 mg b.i.d., metformin 1000 mg b.i.d., Sinemet 25/100 one tab t.i.d., trazodone 100 mg at bedtime, Effexor extended release 37.5 mg daily.   PHYSICAL EXAMINATION: Presently is as follows: VITAL SIGNS:  Temperature is 98.2, pulse 74, respirations 20, blood pressure 123/61, sats 97% on room air.  GENERAL: She is a pleasant-appearing female in no apparent distress.  HEAD, EYES, EARS, NOSE AND THROAT: Atraumatic, normocephalic. Extraocular muscles are intact. Pupils are equal and reactive to light. Sclerae anicteric. No conjunctival injection. No pharyngeal erythema.  NECK: Supple. No jugular venous distention. No bruits. No lymphadenopathy or thyromegaly.  HEART: Regular rate and rhythm. No murmurs. No rubs. No clicks.  LUNGS: Clear to auscultation bilaterally. No rales, rhonchi. No wheezes.  ABDOMEN: Soft, flat, nontender, nondistended. Has good bowel sounds. No hepatosplenomegaly appreciated.  EXTREMITIES: No evidence of any cyanosis, clubbing or peripheral edema. Has +2 pedal and radial pulses bilaterally.  NEUROLOGICAL: The patient is  alert, awake, oriented x 3. No focal motor or sensory deficits appreciated bilaterally. Globally weak.  SKIN: Moist and warm with no rashes.  LYMPHATIC: There is no cervical,  axillary lymphadenopathy.   LABORATORY DATA: Serum glucose of 148, BUN 13, creatinine 0.6. Sodium 140, potassium 3.6, chloride 106, bicarb 31. LFTs within normal limits. Troponin 0.06. White cell count 8, hemoglobin 12.1, hematocrit 37.3, platelet count 151. Urinalysis within normal limits.   The patient did have a CT of the head done which showed no acute intracranial abnormality. The patient also had a chest x-ray done which showed no evidence of any acute cardiopulmonary disease. Bilateral multiple old rib fractures.   ASSESSMENT AND PLAN: This is a 80 year old female with past medical history of dementia with Parkinson disease, hypertension, diabetes, hyperlipidemia, coronary disease, status post bypass, anxiety, who presents to the hospital due to lethargy, weakness  and altered mental status and noted to be hypoglycemic, also noted to have a mildly elevated troponin.  PROBLEM: 1.  Altered mental status/weakness. This is likely due to hypoglycemia and also underlying dementia and deconditioning. The patient's CT head does not show any evidence of any acute abnormality. Her blood sugars have improved and we will continue to monitor her mental status. There is no acute infectious source. I will get a physical therapy consult to assess her mobility. 2.  Elevated troponin. The patient acutely has no chest pain and no acute EKG changes. I will observe her on off unit telemetry, follow serial cardiac markers, continue aspirin and statin for now. 3.  Diabetes with episodes of hypoglycemia. Hold metformin and glipizide, place her on sliding scale insulin for now. Continue carbohydrate-controlled diet. Her diabetic medications may need to be adjusted prior to discharge.  4.  Hyperlipidemia. Continue atorvastatin. 5.  Anxiety. Continue Effexor. 6.  Dementia with Parkinson disease. Continue Aricept and Sinemet.   CODE STATUS: The patient is a full code.   TIME SPENT: 50 minutes.     ____________________________ Rolly PancakeVivek J. Cherlynn KaiserSainani, MD vjs:ce D: 08/01/2013 13:34:12 ET T: 08/01/2013 15:25:03 ET JOB#: [id number]  cc: Rolly PancakeVivek J. Cherlynn KaiserSainani, MD, <Dictator> Houston SirenVIVEK J Zelda Reames MD ELECTRONICALLY SIGNED 08/09/2013 18:45

## 2014-08-21 NOTE — Discharge Summary (Signed)
PATIENT NAME:  Kimberly ManuelFOWLER, Ailynn B MR#:  [id number] DATE OF BIRTH:  1934-06-21  DATE OF ADMISSION:  08/01/2013 DATE OF DISCHARGE:  08/02/2013  ADMISSION DIAGNOSIS: Altered mental status secondary to hypoglycemia.   DISCHARGE DIAGNOSES:  1. Altered mental status/metabolic encephalopathy secondary to hypoglycemia.  2. Elevated troponin. 3. Diabetes.  4. Hyperlipidemia.   CONSULTATIONS: None.   PERTINENT LABORATORIES AT DISCHARGE: Troponin,  initial was 0.05. Troponin x 2 is 0.004.   RADIOLOGICAL DATA:  CT of the head showed no acute cranial hemorrhage.   Chest x-ray showed no acute cardiopulmonary disease.  HOSPITAL COURSE: A 80 year old female with a history of diabetes, dementia, coronary artery disease status post CABG and anxiety who presented with lethargy and weakness noted to be hypoglycemic. For further details, please refer to H and P.   ASSESSMENT: 1. Altered mental status/acute encephalopathy, metabolic, with weakness, likely due to hypoglycemia and underlying dementia. CT head was negative. Blood sugar is improved as her mental status is now back to baseline . There is no acute infectious source.  2. Elevated troponin. No chest pain. No acute EKG changes likely due to hypoglycemia. Initial troponin was 0.05.  The other two after that was 0.04. The patient will continue outpatient medications.  3. Diabetes with episodes of hypoglycemia. We stopped her diabetic medications for now. I have asked the daughter to monitor her blood sugars and follow up with her PCP.  4. Hyperlipidemia.   Patient will continue outpatient medications.  5. History of coronary artery disease. The patient will continue on outpatient medications.   DISCHARGE MEDICATIONS:  1. Aspirin 81 mg daily.  2. Atorvastatin 40 mg at bedtime.  3. Effexor 37.5 mg daily.  4. Trazodone 100 mg at bedtime.  5. Sinemet 25/100 t.i.d.  6. Donepezil 5 mg at bedtime.  7. Aleve 220 daily.  8. The patient will stop taking  glipizide and metformin. The patient will monitor blood sugars if consistently over greater than 200 two hours after meals and restart glipizide 5 mg b.i.d.   DISCHARGE DIET: ADA diet.   DISCHARGE ACTIVITY: As tolerated.   DISCHARGE FOLLOWUP: Patient will follow up with Dr. Rolm GalaHeidi Grandis in 1 week.   TIME SPENT:  35 minutes.   The patient is stable for discharge.    ____________________________ Wallice Granville P. Juliene PinaMody, MD spm:dd D: 08/02/2013 12:25:38 ET T: 08/02/2013 18:25:07 ET JOB#: [id number]  cc: Aneka Fagerstrom P. Juliene PinaMody, MD, <Dictator> Kilea Mccarey P Judia Arnott MD ELECTRONICALLY SIGNED 08/02/2013 21:20

## 2014-08-22 NOTE — H&P (Signed)
PATIENT NAME:  Kimberly Rodgers, FIORELLO MR#:  [id number] DATE OF BIRTH:  Mar 08, 1935  DATE OF ADMISSION:  10/03/2011  PRIMARY CARE PHYSICIAN: Dr. Clayborn Bigness. ED REFERRING PHYSICIAN: Dr. Margarita Grizzle.   CHIEF COMPLAINT: Right-sided numbness involving her lower extremity, hands and some right lower face numbness.   HISTORY OF PRESENT ILLNESS: The patient is a 80 year old white female with previous history of coronary artery disease, hypertension, seizure, diabetes type 2, hypertension, who presents with complaint of having right lower extremity numbness that started about a month ago. She reports initially it was in her foot and then extended up to her thighs. These symptoms were going on for a month, so she was seen at vascular surgery, had ABIs which apparently were not significantly abnormal. Then about a week ago, she started having numbness in the right lower aspect of her face for about a week and then just yesterday started to have right hand numbness and tingling. The patient came in to the ED and CT scan of the head done in the ED showed no acute abnormality. The patient otherwise denies any difficulty with swallowing. The patient does have chronic difficulty with walking due to ankle pain and is not very mobile. Otherwise, she denies any fevers, chills. No cough. No abdominal pain. No nausea, vomiting, or diarrhea.   PAST MEDICAL HISTORY:  1. History of seizure. She was placed on seizure medication which she has been taken off of.  2. History of coronary artery disease, status post coronary artery bypass graft.  3. History of aspiration pneumonitis in the past.  4. Diabetes type 2.  5. Hypertension.  6. History of chronic obstructive pulmonary disease, not on any inhalers.  7. Hyperlipidemia.  8. Depression.  9. Peptic ulcer disease.  10. Irritable bowel syndrome.  11. Chronic back pain with spinal stenosis in the past.  12. Cardiomyopathy with ejection fraction of 45%.   PAST SURGICAL  HISTORY:  1. Status post cholecystectomy.  2. Status post three vessel bypass.  3. Status post hernia repair.  4. Status post appendectomy.  5. Status post hysterectomy.  6. Status post abdominal surgery for lysis of adhesions.  7. Status post polypectomy.   CURRENT MEDICATIONS:  1. Amlodipine daily, dose is not known. 2. Glipizide 5 mg daily.  3. Lipitor 40 mg 1 tab p.o. daily.  4. Trazodone 400 mg at bedtime.  5. Aleve as needed.   ALLERGIES: Allergy to clindamycin, codeine, penicillin, sulfa drugs.   SOCIAL HISTORY: Continues to smoke 1 pack per day. Denies any significant alcohol use. No drug use.   FAMILY HISTORY: History of stomach cancer in her mother, lung cancer in her father and diabetes in the family.   REVIEW OF SYSTEMS: CONSTITUTIONAL: Denies any fevers. Complains of some fatigue and weakness. Has chronic back pain. No weight loss. No weight gain. EYES: No blurred or double vision. No pain. No redness. No inflammation. No glaucoma. No cataracts. ENT: No tinnitus. No ear pain. No hearing loss. No seasonal or year-round allergies. No epistaxis. No nasal discharge. No postnasal drip. No difficulty swallowing. RESPIRATORY: No cough. No wheezing. No hemoptysis. No tuberculosis. Does have chronic obstructive pulmonary disease, but is asymptomatic. CARDIOVASCULAR: No chest pain. No orthopnea. No edema. No arrhythmia. No syncope. GASTROINTESTINAL: No nausea, vomiting, diarrhea. No abdominal pain. No hematemesis. No melena. No rectal bleeding. GENITOURINARY: Denies any dysuria, hematuria, renal colic or frequency. ENDOCRINE: Denies any polyuria, nocturia, or thyroid problems. HEME/LYMPH: Denies anemia, easy bruisability. Does report easy bleeding. SKIN: No  acne. No rash. No changes in mole, hair or skin. MUSCULOSKELETAL: Has chronic back pain. NEUROLOGICAL: Complains of numbness as above and weakness. No history of cerebrovascular accident or transient ischemic attack. PSYCHIATRIC: Does  have a history of anxiety and depression.   PHYSICAL EXAMINATION:  VITAL SIGNS: Temperature 98.2, pulse 73, respirations 18, blood pressure 146/72, O2 92%.   GENERAL: The patient is a 80 year old in no acute distress.   HEENT: Head atraumatic, normocephalic. Pupils equally round, reactive to light and accommodation. Extraocular movements intact. There is no conjunctival pallor. Nasal exam shows no ulceration or drainage.   NECK: No thyromegaly. No carotid bruits.   CARDIOVASCULAR: Regular rate and rhythm. No murmurs, rubs, clicks, or gallops. PMI is not displaced.   LUNGS: Clear to auscultation bilaterally without any rales, rhonchi, or wheezing.   ABDOMEN: Soft, nontender, nondistended. Positive bowel sounds x4.   EXTREMITIES: No clubbing, cyanosis, or edema.   SKIN: No rash.   LYMPHATICS: No lymph nodes palpable.   VASCULAR: Diminished DP, PT pulses in the lower extremity.   MUSCULOSKELETAL: There is no erythema or swelling.   NEUROLOGIC: The patient is awake, alert, oriented x3. Cranial nerves II through XII grossly intact. There is some diminished sensation in the lower aspect of her right face and her right lower extremity below her knee. Strength is five out of five in all four extremities. Reflexes 2+. Babinski's downgoing.   LABORATORY, DIAGNOSTIC, AND RADIOLOGICAL DATA: In the ED, CT scan of the head shows no acute abnormality. Her WBC count 7.5, hemoglobin 14.9, platelet count 126, glucose 237, BUN 23, creatinine 0.74, sodium 142, potassium 4.4, chloride 107, CO2 27. LFTs were normal. Urinalysis: Nitrites negative, leukocytes negative. EKG showed normal sinus rhythm with nonspecific ST-T wave changes.   ASSESSMENT AND PLAN: The patient is a 80 year old white female with a history of coronary artery disease, chronic obstructive pulmonary disease, hypertension, and diabetes who presents with numbness involving the right side of her body.  1. Right-sided numbness, could be  cerebrovascular accident, especially a small infarct involving one of the thalami. At this time we will go ahead and order carotid Doppler's, echocardiogram, place her on tele and get a MRI of the brain.  2. Hypertension. Will continue amlodipine as taking at home.  3. Diabetes. Will continue glipizide and sliding scale. 4. Coronary artery disease. Will continue aspirin as taking at home.  5. Hyperlipidemia. Continue Lipitor. Check a fasting lipid panel in the a.m.  6. Miscellaneous. I will place her on Lovenox for deep vein thrombosis prophylaxis.   TIME SPENT: 35 minutes.   ____________________________ Lacie ScottsShreyang H. Allena KatzPatel, MD shp:ap D: 10/03/2011 20:52:25 ET T: 10/04/2011 07:03:36 ET JOB#: [id number]  cc: Joline Encalada H. Allena KatzPatel, MD, <Dictator> Burley SaverL. Katherine Bliss, MD Charise CarwinSHREYANG H Nicola Quesnell MD ELECTRONICALLY SIGNED 10/08/2011 8:02

## 2014-08-22 NOTE — Discharge Summary (Signed)
PATIENT NAME:  Kimberly Rodgers, Kimberly Rodgers MR#:  [id number] DATE OF BIRTH:  03/08/1935  DATE OF ADMISSION:  10/03/2011 DATE OF DISCHARGE:  10/04/2011  PRIMARY CARE PHYSICIAN: Dr. Quillian QuinceBliss    FINAL DIAGNOSES:  1. Transient ischemic attack.  2. Hypertension.  3. Hyperlipidemia and hypertriglyceridemia.  4. Diabetes.  5. Tobacco abuse.   MEDICATIONS ON DISCHARGE:  1. Lipitor 40 mg at bedtime.  2. Amlodipine 5 mg daily.  3. Trazodone 200 mg at bedtime.  4. Vitamin D 400 international units daily.  5. Aspirin 81 mg p.o. daily.  6. Glipizide XL 5 mg 1 tablet twice a day and that was increased.   DIET: Low sodium, 1800 ADA diet.   ACTIVITY: Activity as tolerated.   FOLLOW UP: Follow up with your back surgeon as outpatient. Follow up in 1 to 2 weeks with Dr. Quillian QuinceBliss. I recommend stop smoking.   REASON FOR ADMISSION: Patient was admitted on 10/03/2011, discharged 10/04/2011. Came in with right-sided numbness involving her lower extremity, hands and right lower face numbness.   HISTORY OF PRESENT ILLNESS: 80 year old female history of heart disease, hypertension, seizure disorder, diabetes, had right lower extremity numbness and weakness and then had more symptoms in the right arm and face and decided to come in for further evaluation. Patient was admitted for suspected stroke.   LABORATORY, DIAGNOSTIC AND RADIOLOGICAL DATA: EKG showed normal sinus rhythm, RSR prime. TSH 0.692. Urinalysis negative. Glucose 237, BUN 23, creatinine 0.74, sodium 142, potassium 4.4, chloride 107, CO2 27, calcium 9.3. Liver function tests normal. White blood cell count 7.5, hemoglobin and hematocrit 14.9 and 44.7, platelet count 126. CT scan of the head showed stable CT scan of the brain with changes of atrophy and chronic small vessel ischemic disease. No acute intracranial abnormality. Vitamin B12 level 708, LDL 52, HDL 32, triglycerides 331. Hemoglobin A1c 10.0. Echocardiogram showed no evidence of clot. Ejection fraction greater  than 55%, impaired left ventricular relaxation. Ultrasound of the carotids bilaterally showed no hemodynamically significant stenosis. MRI of the brain without contrast showed involutional changes without evidence of focal or acute abnormalities.   HOSPITAL COURSE PER PROBLEM LIST:  1. For the patient's right-sided numbness, symptoms on the face and arm went away. She still did have some symptoms on the leg. I will call this a transient ischemic attack. MRI of the brain was negative. I recommended an aspirin on a daily basis since she does not take it. Her LDL was good. She is already on Lipitor. Carotid ultrasound and echocardiogram did not show any source of a transient ischemic attack.  2. For the patient's hypertension, she was kept on her Norvasc. Blood pressure 131/75 upon discharge. 3. For her hyperlipidemia, hypertriglyceridemia she is on Lipitor 10. Can consider TriCor as outpatient but I think the triglycerides are elevated secondary to poorly controlled diabetes.  4. For the patient's diabetes, I bumped up the glipizide XL to twice a day. May have to add more medications as outpatient. Recommend following up with Dr. Quillian QuinceBliss for this in 1 to 2 weeks.  5. Tobacco abuse. Smoking cessation counseling done, three minutes by me but patient is going to smoke upon discharge.  TIME SPENT ON DISCHARGE: 35 minutes.   ____________________________ Herschell Dimesichard J. Renae GlossWieting, MD rjw:cms D: 10/06/2011 14:45:05 ET T: 10/08/2011 11:57:51 ET JOB#: [id number] cc: Herschell Dimesichard J. Renae GlossWieting, MD, <Dictator> Burley SaverL. Katherine Bliss, MD Salley ScarletICHARD J Ziare Orrick MD ELECTRONICALLY SIGNED 10/13/2011 13:06

## 2014-12-27 IMAGING — CR RIGHT HIP - COMPLETE 2+ VIEW
1 series · 3 of 3 positions shown · non-contrast
Comparison: None.

CLINICAL DATA: Fall

EXAM:
RIGHT HIP - COMPLETE 2+ VIEW

[Series 1: t pelvis ap · 0.14mm/px · 3 of 3 slices shown]
[im 1/3]
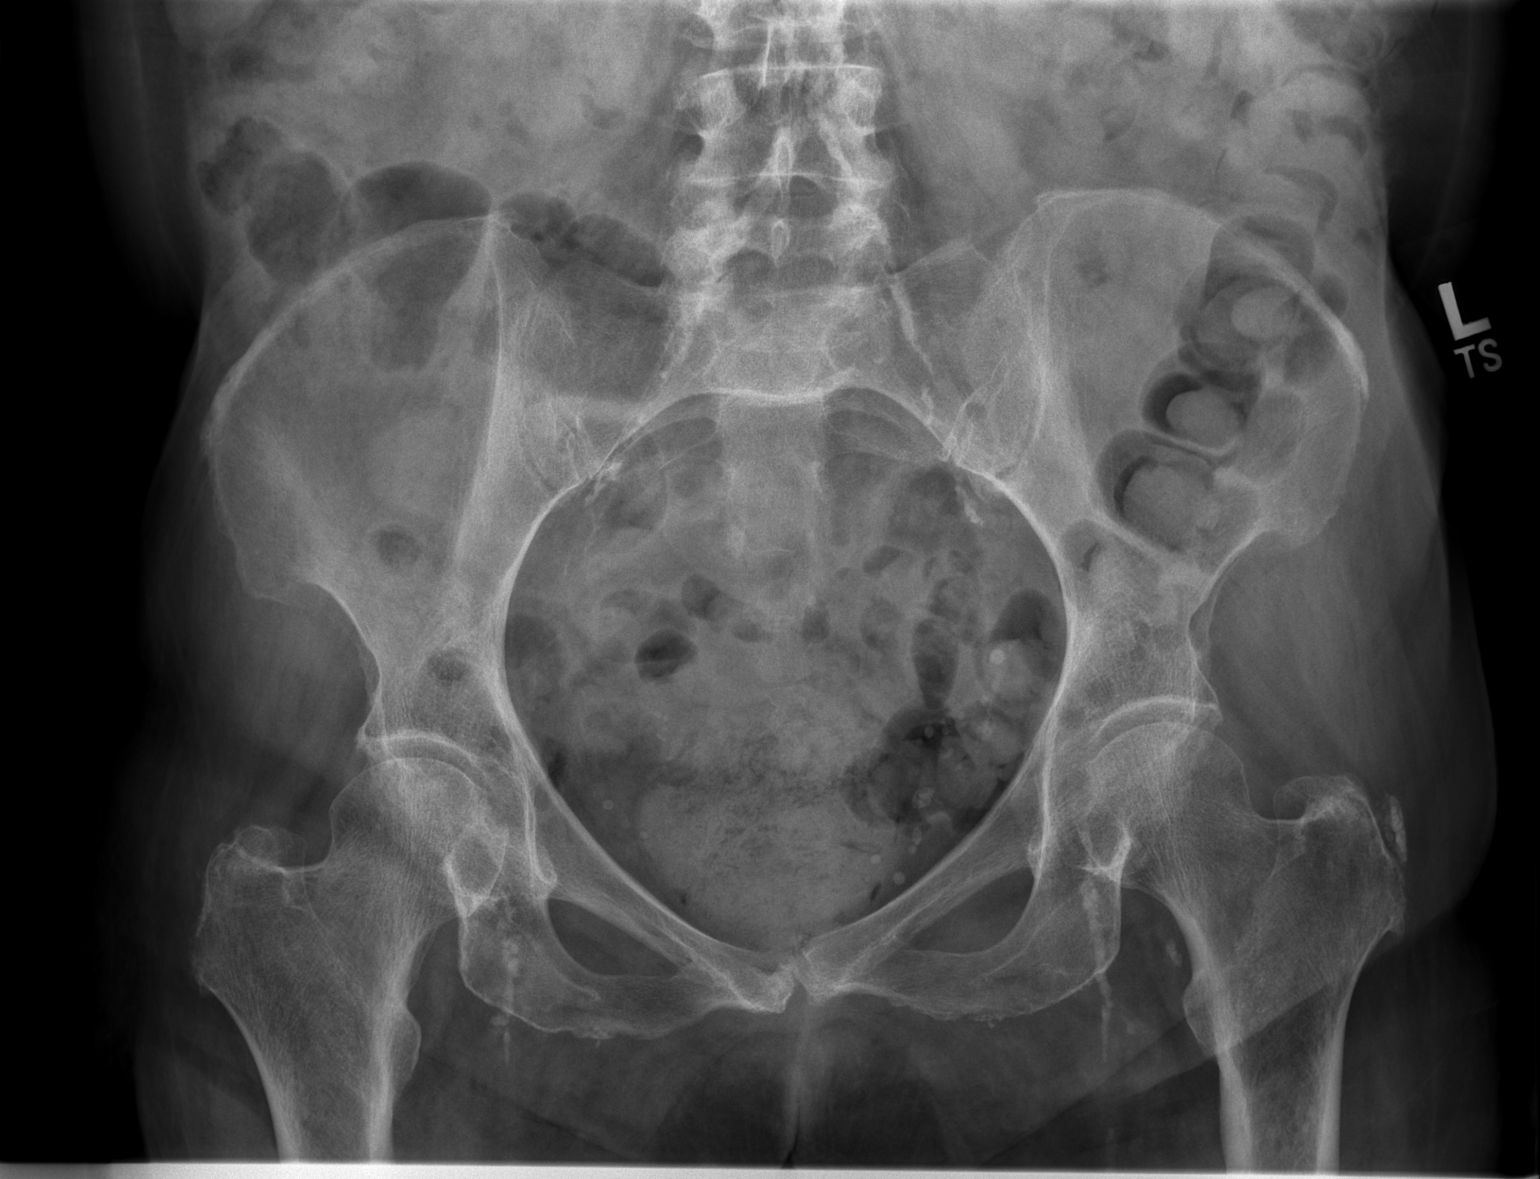
[im 2/3]
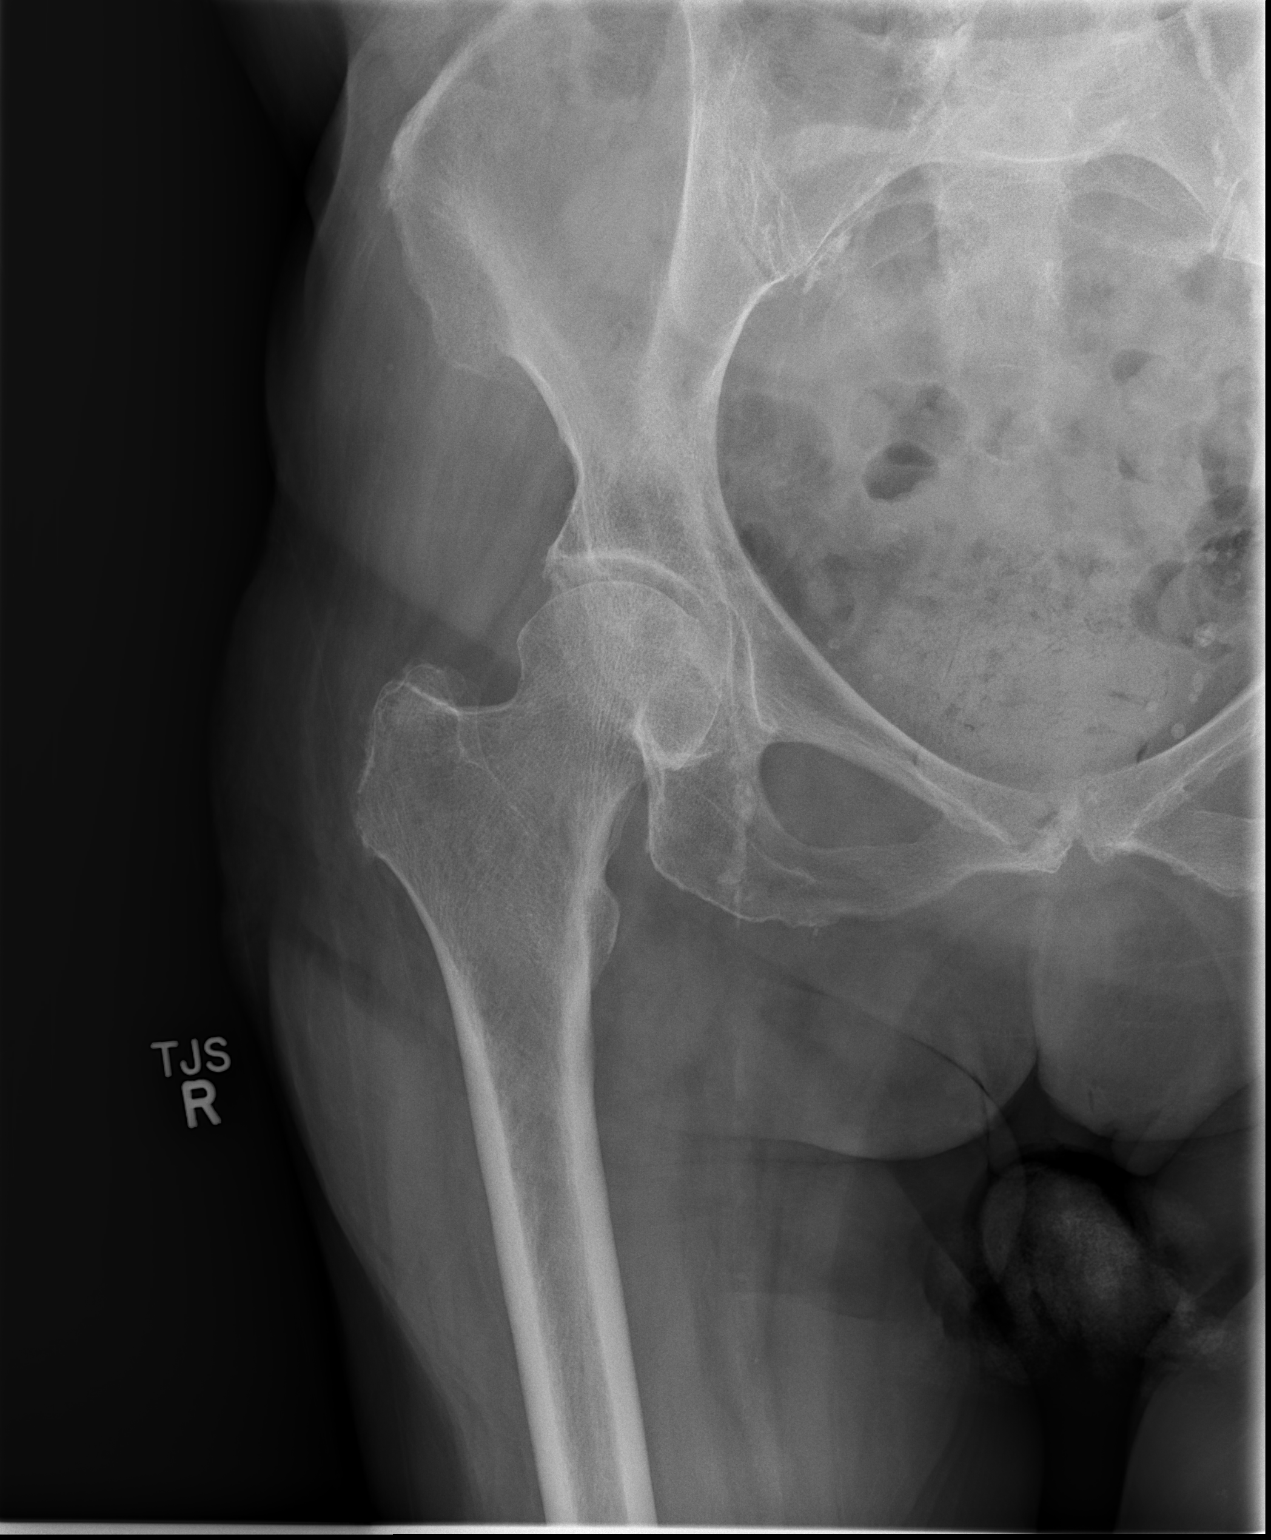
[im 3/3]
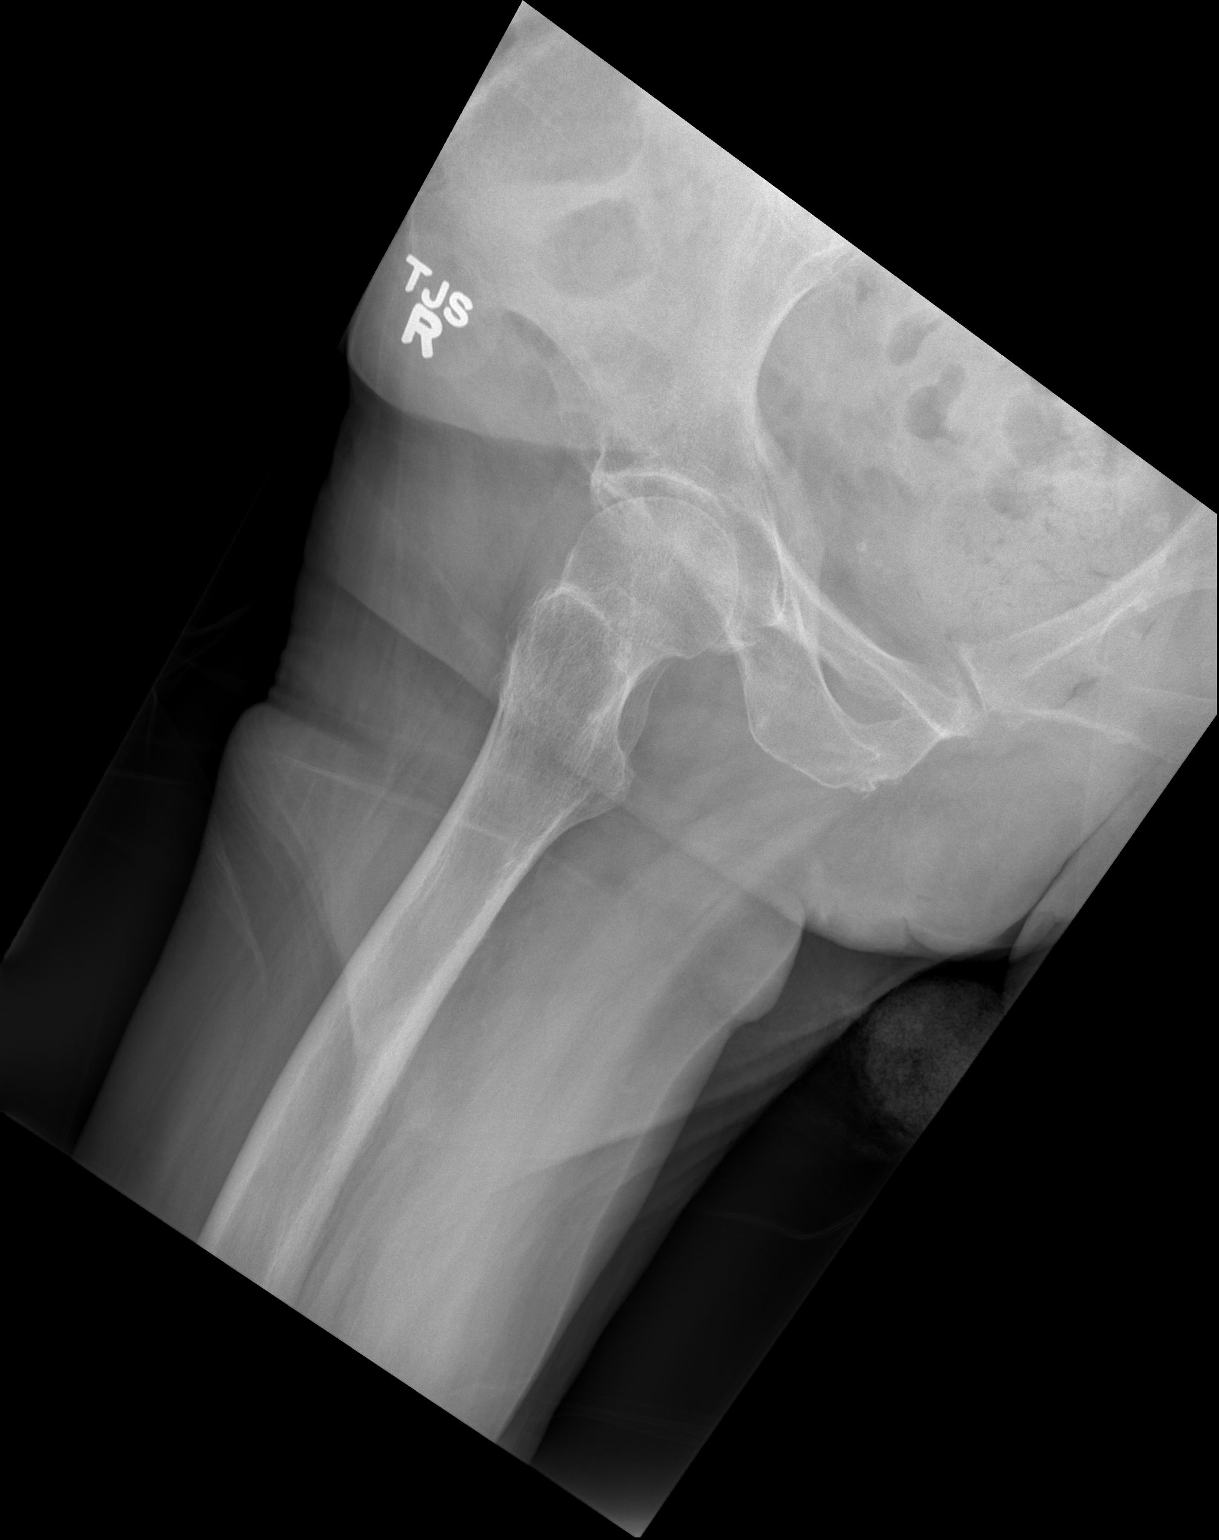

[3 of 3 positions shown; findings below may reference images not displayed]

FINDINGS: No acute fracture. No dislocation. Old avulsion fracture from the
left greater trochanter. Osteopenia.
IMPRESSION: No acute bony pathology.  Chronic changes.

## 2014-12-27 IMAGING — CT CT HEAD WITHOUT CONTRAST
2 series · 14 of 30 positions shown, 16 images · non-contrast
Comparison: CT HEAD W/O CM dated 08/01/2013

CLINICAL DATA: Fall

EXAM:
CT HEAD WITHOUT CONTRAST
TECHNIQUE: Contiguous axial images were obtained from the base of the skull
through the vertex without intravenous contrast.

[Series 2: head wo · axial · 0.41mm/px · z∈[+64,+163]mm · 6 of 32 slices shown, 8 images]
[im 5/32  brain]
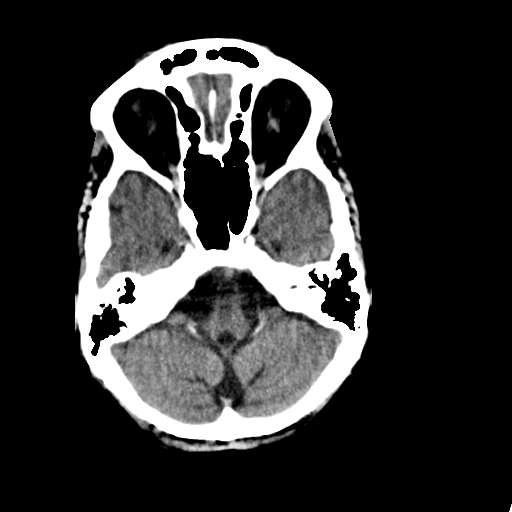
[im 5/32  bone]
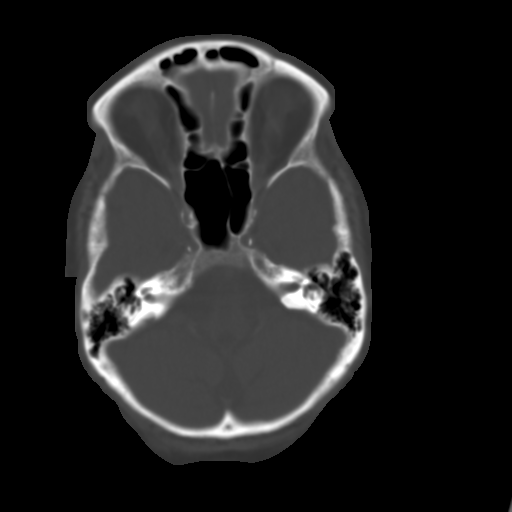
[im 9/32  brain]
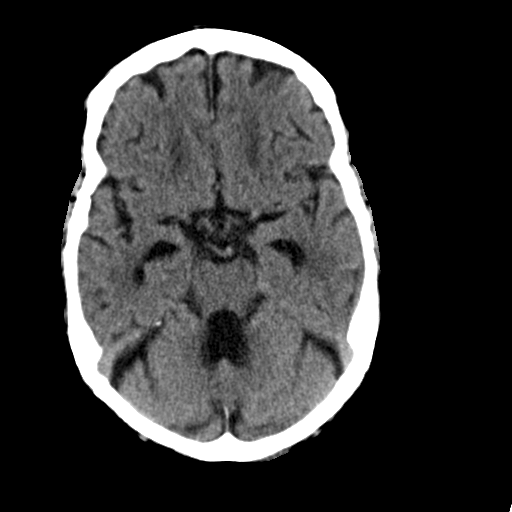
[im 14/32  brain]
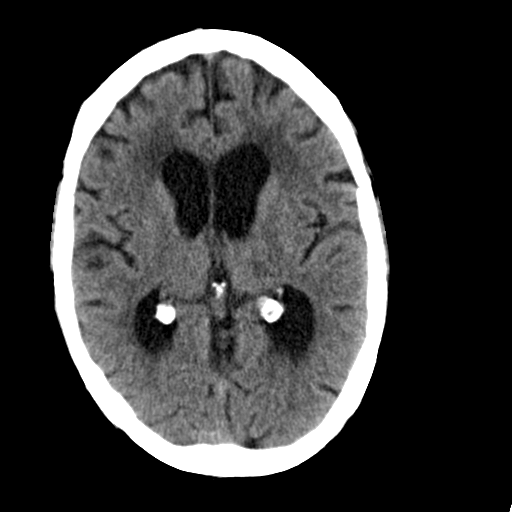
[im 18/32  brain]
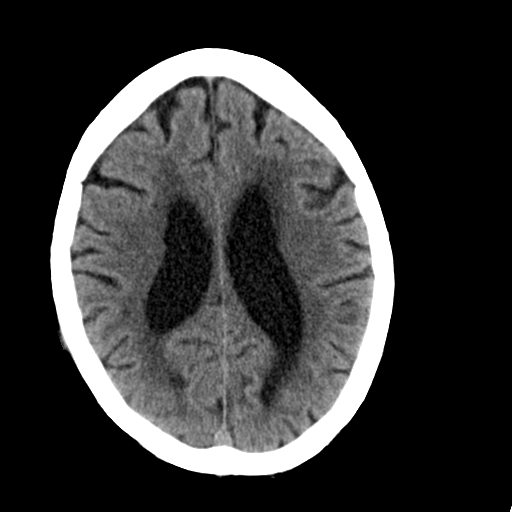
[im 23/32  brain]
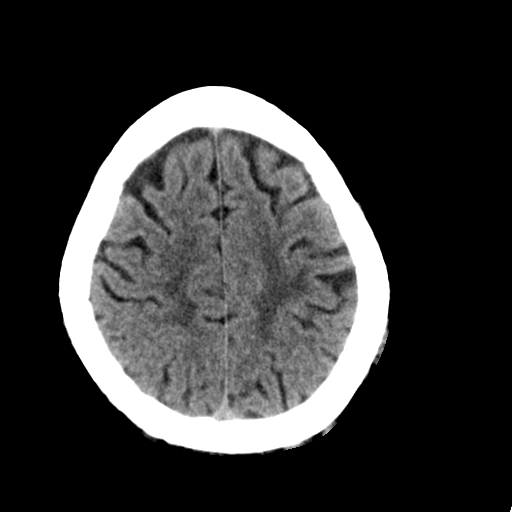
[im 23/32  bone]
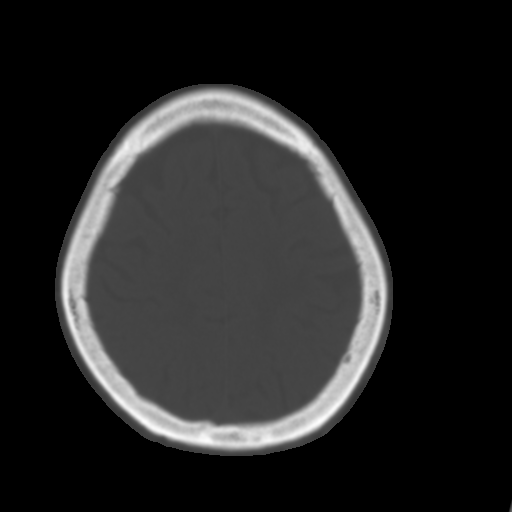
[im 27/32  brain]
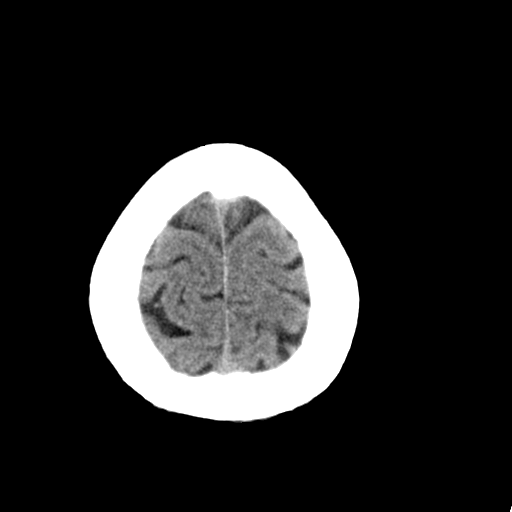

[Series 3: head bone · axial · 0.41mm/px · z∈[+58,+172]mm · 8 of 96 slices shown]
[im 10/96  bone]
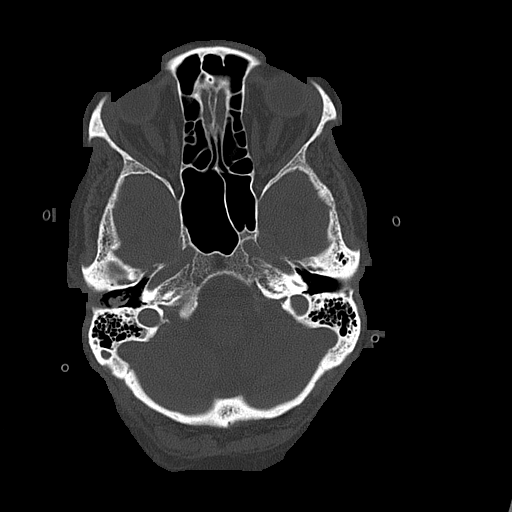
[im 19/96  bone]
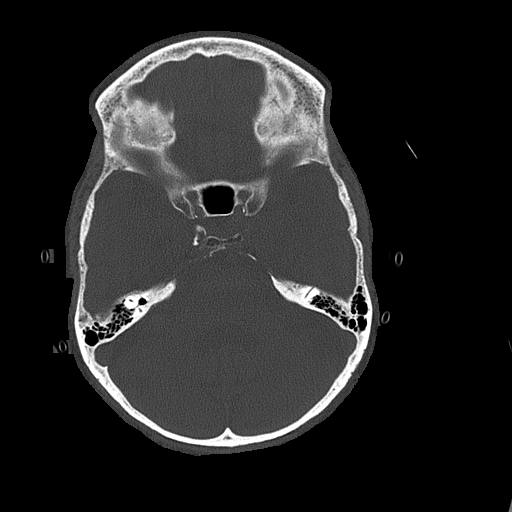
[im 32/96  bone]
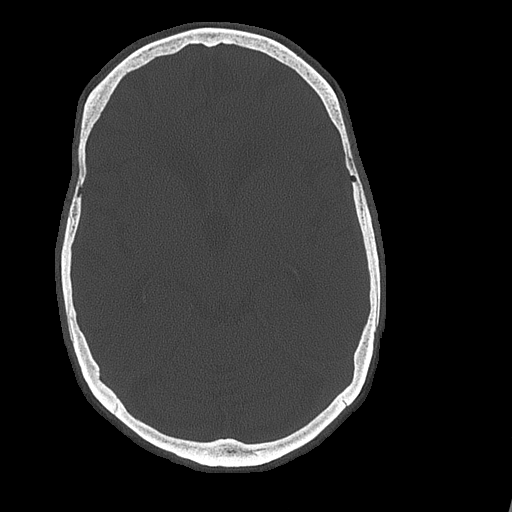
[im 41/96  bone]
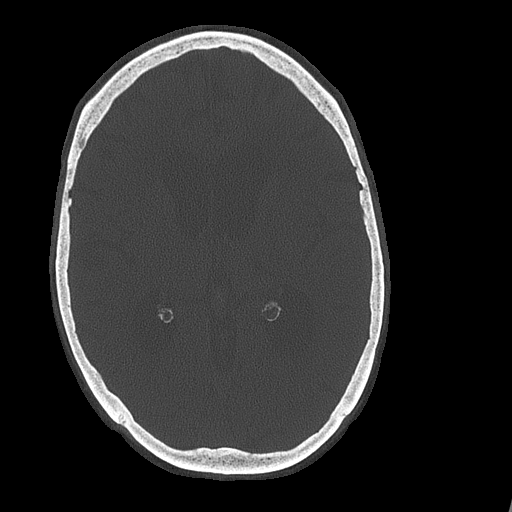
[im 55/96  bone]
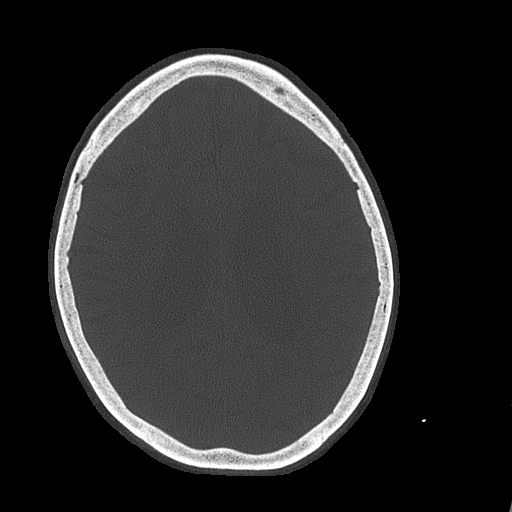
[im 64/96  bone]
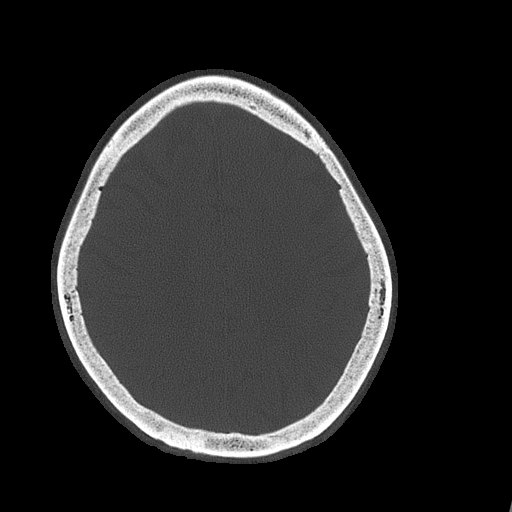
[im 77/96  bone]
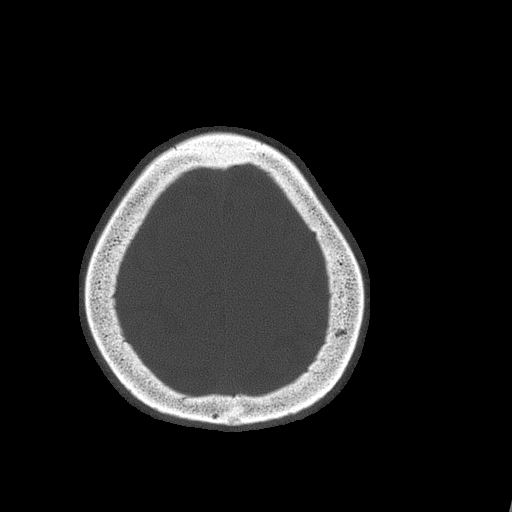
[im 86/96  bone]
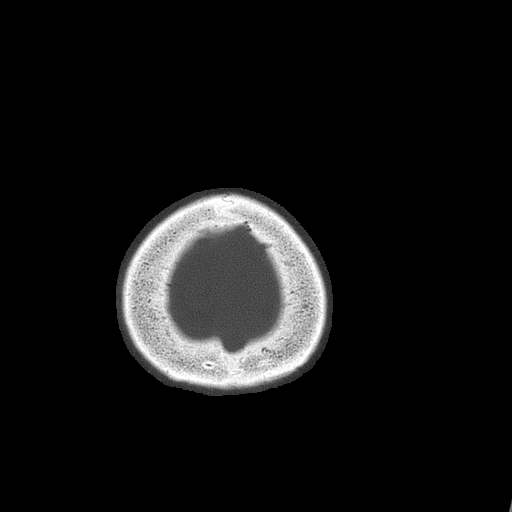

[14 of 30 positions shown; findings below may reference images not displayed]

FINDINGS: Global atrophy. Chronic ischemic change. No mass effect, midline
shift, or acute intracranial hemorrhage mastoid air cells are clear.
Visualized paranasal sinuses are clear. Cranium is intact.
IMPRESSION: No acute intracranial pathology.  Chronic changes are noted.

## 2016-02-17 ENCOUNTER — Inpatient Hospital Stay
Admission: EM | Admit: 2016-02-17 | Discharge: 2016-02-20 | DRG: 871 | Disposition: A | Discharge status: Home Health | Payer: Medicare Other | Attending: Internal Medicine | Admitting: Internal Medicine

## 2016-02-17 ENCOUNTER — Encounter: Payer: Self-pay | Admitting: Internal Medicine

## 2016-02-17 ENCOUNTER — Emergency Department: Payer: Medicare Other

## 2016-02-17 DIAGNOSIS — R0902 Hypoxemia: Secondary | ICD-10-CM

## 2016-02-17 DIAGNOSIS — R509 Fever, unspecified: Secondary | ICD-10-CM

## 2016-02-17 DIAGNOSIS — I2510 Atherosclerotic heart disease of native coronary artery without angina pectoris: Secondary | ICD-10-CM | POA: Diagnosis present

## 2016-02-17 DIAGNOSIS — Z7982 Long term (current) use of aspirin: Secondary | ICD-10-CM | POA: Diagnosis not present

## 2016-02-17 DIAGNOSIS — Z79899 Other long term (current) drug therapy: Secondary | ICD-10-CM

## 2016-02-17 DIAGNOSIS — A419 Sepsis, unspecified organism: Principal | ICD-10-CM | POA: Diagnosis present

## 2016-02-17 DIAGNOSIS — Z951 Presence of aortocoronary bypass graft: Secondary | ICD-10-CM | POA: Diagnosis not present

## 2016-02-17 DIAGNOSIS — E785 Hyperlipidemia, unspecified: Secondary | ICD-10-CM | POA: Diagnosis present

## 2016-02-17 DIAGNOSIS — I10 Essential (primary) hypertension: Secondary | ICD-10-CM | POA: Diagnosis present

## 2016-02-17 DIAGNOSIS — Z7984 Long term (current) use of oral hypoglycemic drugs: Secondary | ICD-10-CM

## 2016-02-17 DIAGNOSIS — E119 Type 2 diabetes mellitus without complications: Secondary | ICD-10-CM | POA: Diagnosis present

## 2016-02-17 DIAGNOSIS — R4182 Altered mental status, unspecified: Secondary | ICD-10-CM

## 2016-02-17 DIAGNOSIS — J449 Chronic obstructive pulmonary disease, unspecified: Secondary | ICD-10-CM | POA: Diagnosis present

## 2016-02-17 DIAGNOSIS — J189 Pneumonia, unspecified organism: Secondary | ICD-10-CM | POA: Diagnosis present

## 2016-02-17 DIAGNOSIS — E876 Hypokalemia: Secondary | ICD-10-CM | POA: Diagnosis present

## 2016-02-17 DIAGNOSIS — J9601 Acute respiratory failure with hypoxia: Secondary | ICD-10-CM | POA: Diagnosis present

## 2016-02-17 DIAGNOSIS — F0390 Unspecified dementia without behavioral disturbance: Secondary | ICD-10-CM | POA: Diagnosis present

## 2016-02-17 DIAGNOSIS — F17210 Nicotine dependence, cigarettes, uncomplicated: Secondary | ICD-10-CM | POA: Diagnosis present

## 2016-02-17 DIAGNOSIS — E871 Hypo-osmolality and hyponatremia: Secondary | ICD-10-CM | POA: Diagnosis present

## 2016-02-17 LAB — URINALYSIS COMPLETE WITH MICROSCOPIC (ARMC ONLY)
BACTERIA UA: NONE SEEN
BILIRUBIN URINE: NEGATIVE
GLUCOSE, UA: NEGATIVE mg/dL
HGB URINE DIPSTICK: NEGATIVE
KETONES UR: NEGATIVE mg/dL
LEUKOCYTES UA: NEGATIVE
NITRITE: NEGATIVE
PH: 6 (ref 5.0–8.0)
Protein, ur: NEGATIVE mg/dL
RBC / HPF: NONE SEEN RBC/hpf (ref 0–5)
SPECIFIC GRAVITY, URINE: 1.01 (ref 1.005–1.030)
Squamous Epithelial / LPF: NONE SEEN

## 2016-02-17 LAB — TSH: TSH: 0.753 u[IU]/mL (ref 0.350–4.500)

## 2016-02-17 LAB — CBC WITH DIFFERENTIAL/PLATELET
BASOS ABS: 0 10*3/uL (ref 0–0.1)
BASOS PCT: 1 %
EOS ABS: 0 10*3/uL (ref 0–0.7)
Eosinophils Relative: 0 %
HEMATOCRIT: 42.8 % (ref 35.0–47.0)
Hemoglobin: 14.2 g/dL (ref 12.0–16.0)
Lymphocytes Relative: 21 %
Lymphs Abs: 1.6 10*3/uL (ref 1.0–3.6)
MCH: 28.9 pg (ref 26.0–34.0)
MCHC: 33.1 g/dL (ref 32.0–36.0)
MCV: 87.2 fL (ref 80.0–100.0)
MONO ABS: 0.6 10*3/uL (ref 0.2–0.9)
MONOS PCT: 8 %
NEUTROS ABS: 5.4 10*3/uL (ref 1.4–6.5)
Neutrophils Relative %: 70 %
PLATELETS: 156 10*3/uL (ref 150–440)
RBC: 4.92 MIL/uL (ref 3.80–5.20)
RDW: 14.4 % (ref 11.5–14.5)
WBC: 7.7 10*3/uL (ref 3.6–11.0)

## 2016-02-17 LAB — COMPREHENSIVE METABOLIC PANEL
ALBUMIN: 3.7 g/dL (ref 3.5–5.0)
ALK PHOS: 102 U/L (ref 38–126)
ALT: 22 U/L (ref 14–54)
ANION GAP: 8 (ref 5–15)
AST: 30 U/L (ref 15–41)
BILIRUBIN TOTAL: 0.4 mg/dL (ref 0.3–1.2)
BUN: 10 mg/dL (ref 6–20)
CALCIUM: 9.1 mg/dL (ref 8.9–10.3)
CO2: 26 mmol/L (ref 22–32)
CREATININE: 0.54 mg/dL (ref 0.44–1.00)
Chloride: 97 mmol/L — ABNORMAL LOW (ref 101–111)
GFR calc non Af Amer: >60 mL/min (ref >60)
GLUCOSE: 140 mg/dL — AB (ref 65–99)
Potassium: 3.6 mmol/L (ref 3.5–5.1)
Sodium: 131 mmol/L — ABNORMAL LOW (ref 135–145)
TOTAL PROTEIN: 7.1 g/dL (ref 6.5–8.1)

## 2016-02-17 LAB — LACTIC ACID, PLASMA
LACTIC ACID, VENOUS: 2 mmol/L — AB (ref 0.5–1.9)
LACTIC ACID, VENOUS: 1.8 mmol/L (ref 0.5–1.9)

## 2016-02-17 LAB — GLUCOSE, CAPILLARY
Glucose-Capillary: 103 mg/dL — ABNORMAL HIGH (ref 65–99)
Glucose-Capillary: 105 mg/dL — ABNORMAL HIGH (ref 65–99)
Glucose-Capillary: 94 mg/dL (ref 65–99)
Glucose-Capillary: 98 mg/dL (ref 65–99)

## 2016-02-17 LAB — INFLUENZA PANEL BY PCR (TYPE A & B)
H1N1 flu by pcr: NOT DETECTED
INFLBPCR: NEGATIVE
Influenza A By PCR: NEGATIVE

## 2016-02-17 LAB — TROPONIN I

## 2016-02-17 MED ORDER — DOCUSATE SODIUM 100 MG PO CAPS
100.0000 mg | ORAL_CAPSULE | Freq: Two times a day (BID) | ORAL | Status: DC
  Administered 2016-02-17 – 2016-02-20 (×7): 100 mg via ORAL
  Filled 2016-02-17 (×7): qty 1

## 2016-02-17 MED ORDER — ONDANSETRON HCL 4 MG PO TABS: 4.0000 mg | ORAL_TABLET | Freq: Four times a day (QID) | ORAL | Status: DC | PRN

## 2016-02-17 MED ORDER — ONDANSETRON HCL 4 MG/2ML IJ SOLN: 4.0000 mg | Freq: Four times a day (QID) | INTRAMUSCULAR | Status: DC | PRN

## 2016-02-17 MED ORDER — ASPIRIN EC 81 MG PO TBEC
81.0000 mg | DELAYED_RELEASE_TABLET | Freq: Every day | ORAL | Status: DC
  Administered 2016-02-17 – 2016-02-20 (×4): 81 mg via ORAL
  Filled 2016-02-17 (×4): qty 1

## 2016-02-17 MED ORDER — ENOXAPARIN SODIUM 40 MG/0.4ML ~~LOC~~ SOLN
40.0000 mg | SUBCUTANEOUS | Status: DC
  Administered 2016-02-17 – 2016-02-19 (×3): 40 mg via SUBCUTANEOUS
  Filled 2016-02-17 (×2): qty 0.4

## 2016-02-17 MED ORDER — LEVOFLOXACIN IN D5W 750 MG/150ML IV SOLN
750.0000 mg | Freq: Once | INTRAVENOUS | Status: AC
  Administered 2016-02-17: 750 mg via INTRAVENOUS
  Filled 2016-02-17: qty 150

## 2016-02-17 MED ORDER — SODIUM CHLORIDE 0.9 % IV SOLN
INTRAVENOUS | Status: DC
  Administered 2016-02-17 – 2016-02-18 (×3): via INTRAVENOUS

## 2016-02-17 MED ORDER — SODIUM CHLORIDE 0.9 % IV BOLUS (SEPSIS)
500.0000 mL | Freq: Once | INTRAVENOUS | Status: AC
  Administered 2016-02-17: 500 mL via INTRAVENOUS

## 2016-02-17 MED ORDER — LEVOFLOXACIN IN D5W 750 MG/150ML IV SOLN: 750.0000 mg | INTRAVENOUS | Status: DC

## 2016-02-17 MED ORDER — ACETAMINOPHEN 650 MG RE SUPP: 650.0000 mg | Freq: Four times a day (QID) | RECTAL | Status: DC | PRN

## 2016-02-17 MED ORDER — SODIUM CHLORIDE 0.9 % IV BOLUS (SEPSIS)
1000.0000 mL | Freq: Once | INTRAVENOUS | Status: AC
  Administered 2016-02-17: 1000 mL via INTRAVENOUS

## 2016-02-17 MED ORDER — VENLAFAXINE HCL ER 37.5 MG PO CP24
37.5000 mg | ORAL_CAPSULE | Freq: Every day | ORAL | Status: DC
  Administered 2016-02-17 – 2016-02-20 (×4): 37.5 mg via ORAL
  Filled 2016-02-17 (×4): qty 1

## 2016-02-17 MED ORDER — INSULIN ASPART 100 UNIT/ML ~~LOC~~ SOLN
0.0000 [IU] | Freq: Three times a day (TID) | SUBCUTANEOUS | Status: DC
  Administered 2016-02-18: 1 [IU] via SUBCUTANEOUS
  Administered 2016-02-18: 2 [IU] via SUBCUTANEOUS
  Administered 2016-02-19 – 2016-02-20 (×3): 1 [IU] via SUBCUTANEOUS
  Filled 2016-02-17 (×2): qty 1
  Filled 2016-02-17: qty 2
  Filled 2016-02-17: qty 1

## 2016-02-17 MED ORDER — TRAZODONE HCL 100 MG PO TABS
100.0000 mg | ORAL_TABLET | Freq: Every day | ORAL | Status: DC
  Administered 2016-02-17 – 2016-02-19 (×3): 100 mg via ORAL
  Filled 2016-02-17 (×3): qty 1

## 2016-02-17 MED ORDER — SODIUM CHLORIDE 0.9 % IV BOLUS (SEPSIS)
250.0000 mL | Freq: Once | INTRAVENOUS | Status: AC
  Administered 2016-02-17: 250 mL via INTRAVENOUS

## 2016-02-17 MED ORDER — SODIUM CHLORIDE 0.9% FLUSH
3.0000 mL | Freq: Two times a day (BID) | INTRAVENOUS | Status: DC
  Administered 2016-02-17 – 2016-02-20 (×3): 3 mL via INTRAVENOUS

## 2016-02-17 MED ORDER — VANCOMYCIN HCL IN DEXTROSE 1-5 GM/200ML-% IV SOLN
1000.0000 mg | Freq: Once | INTRAVENOUS | Status: AC
  Administered 2016-02-17: 1000 mg via INTRAVENOUS
  Filled 2016-02-17: qty 200

## 2016-02-17 MED ORDER — VANCOMYCIN HCL IN DEXTROSE 1-5 GM/200ML-% IV SOLN
1000.0000 mg | INTRAVENOUS | Status: DC
  Filled 2016-02-17: qty 200

## 2016-02-17 MED ORDER — ACETAMINOPHEN 325 MG PO TABS
650.0000 mg | ORAL_TABLET | Freq: Four times a day (QID) | ORAL | Status: DC | PRN
  Administered 2016-02-18: 650 mg via ORAL
  Filled 2016-02-17: qty 2

## 2016-02-17 MED ORDER — ACETAMINOPHEN 650 MG RE SUPP
975.0000 mg | Freq: Once | RECTAL | Status: AC
  Administered 2016-02-17: 975 mg via RECTAL
  Filled 2016-02-17: qty 1

## 2016-02-17 MED ORDER — INSULIN ASPART 100 UNIT/ML ~~LOC~~ SOLN: 0.0000 [IU] | Freq: Every day | SUBCUTANEOUS | Status: DC

## 2016-02-17 NOTE — H&P (Signed)
Kimberly Rodgers is an 81 y.o. female.   Chief Complaint: Altered mental status HPI: The patient with past medical history of diabetes, coronary artery disease and dementia presents to the emergency department due to the concern of her daughter for decreased responsiveness. The patient's daughter is her primary caregiver and states that her mom had a "glassy appearance in her eyes" and was not responding verbally as she usually does. She states that the patient also lost continence of urine which also had a very strong odor. Upon EMS arrival the patient felt warm to touch. In the emergency [place] she was found to have a rectal temperature of 102.49F as well as tachycardia. Chest x-ray revealed pneumonia, and after initiation of sepsis protocol the emergency department staff called for admission.  Past Medical History:  Diagnosis Date  . Coronary artery disease   . Diabetes mellitus without complication ([place])   . Hyperlipidemia   . Hypertension   . Hypoxemia   . Memory loss   . Personal history of fall     Past Surgical History:  Procedure Laterality Date  . CARDIAC CATHETERIZATION  02/16/2009  . CATARACT EXTRACTION, BILATERAL    . CORONARY ARTERY BYPASS GRAFT  1998   CABG x 3 @ [place]    . IMPLANTABLE CONTACT LENS IMPLANTATION    . TONSILLECTOMY    . TOTAL VAGINAL HYSTERECTOMY      Family History  Problem Relation Age of Onset  . Hyperlipidemia Sister    Social History:  reports that she has been smoking Cigarettes.  She has a 15.25 pack-year smoking history. She does not have any smokeless tobacco history on file. She reports that she does not drink alcohol or use drugs.  Allergies:  Allergies  Allergen Reactions  . Codeine Other (See Comments)    Reaction: unknown  . Penicillins Other (See Comments)    Reaction: unknown  . Pregabalin Other (See Comments)    Reaction: unknown  . Wellbutrin [Bupropion] Other (See Comments)    Reaction: unknown     Prior to Admission medications   Medication Sig Start Date End Date Taking? Authorizing Provider  aspirin EC 81 MG tablet Take 81 mg by mouth daily.   Yes Historical Provider, MD  metFORMIN (GLUCOPHAGE) 500 MG tablet Take 500 mg by mouth daily with breakfast.   Yes Historical Provider, MD  traZODone (DESYREL) 100 MG tablet Take 100 mg by mouth at bedtime.  06/08/13  Yes Historical Provider, MD  venlafaxine XR (EFFEXOR-XR) 37.5 MG 24 hr capsule Take 37.5 mg by mouth daily with breakfast.  05/12/13  Yes Historical Provider, MD     Results for orders placed or performed during the hospital encounter of 02/17/16 (from the past 48 hour(s))  Comprehensive metabolic panel     Status: Abnormal   Collection Time: 02/17/16  1:11 AM  Result Value Ref Range   Sodium 131 (L) 135 - 145 mmol/L   Potassium 3.6 3.5 - 5.1 mmol/L   Chloride 97 (L) 101 - 111 mmol/L   CO2 26 22 - 32 mmol/L   Glucose, Bld 140 (H) 65 - 99 mg/dL   BUN 10 6 - 20 mg/dL   Creatinine, Ser 0.54 0.44 - 1.00 mg/dL   Calcium 9.1 8.9 - 10.3 mg/dL   Total Protein 7.1 6.5 - 8.1 g/dL   Albumin 3.7 3.5 - 5.0 g/dL   AST 30 15 - 41 U/L   ALT 22 14 - 54 U/L  Alkaline Phosphatase 102 38 - 126 U/L   Total Bilirubin 0.4 0.3 - 1.2 mg/dL   GFR calc non Af Amer >60 >60 mL/min   GFR calc Af Amer >60 >60 mL/min    Comment: (NOTE) The eGFR has been calculated using the CKD EPI equation. This calculation has not been validated in all clinical situations. eGFR's persistently <60 mL/min signify possible Chronic Kidney Disease.    Anion gap 8 5 - 15  CBC WITH DIFFERENTIAL     Status: None   Collection Time: 02/17/16  1:11 AM  Result Value Ref Range   WBC 7.7 3.6 - 11.0 K/uL   RBC 4.92 3.80 - 5.20 MIL/uL   Hemoglobin 14.2 12.0 - 16.0 g/dL   HCT 42.8 35.0 - 47.0 %   MCV 87.2 80.0 - 100.0 fL   MCH 28.9 26.0 - 34.0 pg   MCHC 33.1 32.0 - 36.0 g/dL   RDW 14.4 11.5 - 14.5 %   Platelets 156 150 - 440 K/uL   Neutrophils Relative % 70 %    Neutro Abs 5.4 1.4 - 6.5 K/uL   Lymphocytes Relative 21 %   Lymphs Abs 1.6 1.0 - 3.6 K/uL   Monocytes Relative 8 %   Monocytes Absolute 0.6 0.2 - 0.9 K/uL   Eosinophils Relative 0 %   Eosinophils Absolute 0.0 0 - 0.7 K/uL   Basophils Relative 1 %   Basophils Absolute 0.0 0 - 0.1 K/uL  Lactic acid, plasma     Status: Abnormal   Collection Time: 02/17/16  1:11 AM  Result Value Ref Range   Lactic Acid, Venous 2.0 (HH) 0.5 - 1.9 mmol/L    Comment: CRITICAL RESULT CALLED TO, READ BACK BY AND VERIFIED WITH SARAH OLEJAR ON 02/17/16 AT 0200 QSD   Troponin I     Status: None   Collection Time: 02/17/16  1:11 AM  Result Value Ref Range   Troponin I <0.03 <0.03 ng/mL  Urinalysis complete, with microscopic ([facility] only)     Status: Abnormal   Collection Time: 02/17/16  1:14 AM  Result Value Ref Range   Color, Urine YELLOW (A) YELLOW   APPearance CLEAR (A) CLEAR   Glucose, UA NEGATIVE NEGATIVE mg/dL   Bilirubin Urine NEGATIVE NEGATIVE   Ketones, ur NEGATIVE NEGATIVE mg/dL   Specific Gravity, Urine 1.010 1.005 - 1.030   Hgb urine dipstick NEGATIVE NEGATIVE   pH 6.0 5.0 - 8.0   Protein, ur NEGATIVE NEGATIVE mg/dL   Nitrite NEGATIVE NEGATIVE   Leukocytes, UA NEGATIVE NEGATIVE   RBC / HPF NONE SEEN 0 - 5 RBC/hpf   WBC, UA 0-5 0 - 5 WBC/hpf   Bacteria, UA NONE SEEN NONE SEEN   Squamous Epithelial / LPF NONE SEEN NONE SEEN  Influenza panel by PCR (type A & B, H1N1)     Status: None   Collection Time: 02/17/16  1:20 AM  Result Value Ref Range   Influenza A By PCR NEGATIVE NEGATIVE   Influenza B By PCR NEGATIVE NEGATIVE   H1N1 flu by pcr NOT DETECTED NOT DETECTED    Comment:        The Xpert Flu assay (FDA approved for nasal aspirates or washes and nasopharyngeal swab specimens), is intended as an aid in the diagnosis of influenza and should not be used as a sole basis for treatment.    Dg Chest Port 1 View  Result Date: 02/17/2016 CLINICAL DATA:  Acute onset of cough and decreased  O2 saturation. Initial  encounter. EXAM: PORTABLE CHEST 1 VIEW COMPARISON:  Chest radiograph performed 09/18/2013 FINDINGS: The lungs are well-aerated. Bibasilar airspace opacities raise concern for pneumonia. Mild vascular congestion is noted. There is no evidence of pleural effusion or pneumothorax. The cardiomediastinal silhouette is enlarged. The patient is status post median sternotomy, with evidence of prior CABG. No acute osseous abnormalities are seen. IMPRESSION: 1. Bibasilar airspace opacities raise concern for pneumonia. 2. Cardiomegaly and mild vascular congestion noted. Electronically Signed   By: Garald Balding M.D.   On: 02/17/2016 02:13    Review of Systems  Unable to perform ROS: Dementia  Neurological: Positive for weakness.    Blood pressure (!) 103/57, pulse 89, temperature (!) 102 F (38.9 C), temperature source Rectal, resp. rate (!) 21, weight 52.3 kg (115 lb 4.8 oz), SpO2 99 %. Physical Exam  Vitals reviewed. Constitutional: She is oriented to person, place, and time. She appears well-developed and well-nourished. No distress.  HENT:  Head: Normocephalic and atraumatic.  Mouth/Throat: Oropharynx is clear and moist.  Eyes: Conjunctivae and EOM are normal. Pupils are equal, round, and reactive to light. No scleral icterus.  Neck: Normal range of motion. Neck supple. No JVD present. No tracheal deviation present. No thyromegaly present.  Cardiovascular: Normal rate, regular rhythm and normal heart sounds.  Exam reveals no gallop and no friction rub.   No murmur heard. Respiratory: Effort normal and breath sounds normal.  GI: Soft. Bowel sounds are normal. She exhibits no distension. There is no tenderness.  Genitourinary:  Genitourinary Comments: Deferred  Musculoskeletal: Normal range of motion. She exhibits no edema.  Lymphadenopathy:    She has no cervical adenopathy.  Neurological: She is alert and oriented to person, place, and time. No cranial nerve deficit. She  exhibits normal muscle tone.  Skin: Skin is warm and dry. No rash noted. No erythema.  Psychiatric: She has a normal mood and affect. Her behavior is normal. Judgment and thought content normal.     Assessment/Plan This is a 81 year old female admitted for sepsis and pneumonia area 1. Sepsis: The patient meets criteria via tachycardia and fever. Influenza negative. She is hemodynamically stable. Blood cultures were obtained in the emergency department and the patient received fluid resuscitation and IV antibiotics. Follow blood cultures for growth and sensitivities. 2. Pneumonia: Community-acquired. The patient is on 2 L of oxygen via nasal cannula as she had oxygen saturations of 88-89% on room air. She was not on oxygen at home medially prior to this admission although she has been on oxygen intermittently at home in the past. Continue Levaquin and vancomycin. 3. Diabetes mellitus type 2: I have held the patient's metformin at this time. Sliding scale insulin while hospitalized. Check hemoglobin A1c. 4. Dementia: The patient is a poor historian. Continue Effexor and trazodone for mood stabilization 5. Coronary artery disease: Stable. Continue aspirin 6. DVT prophylaxis: Lovenox 7. GI prophylaxis: None The patient is a full code. Time spent on admission orders and patient care approximately 45 minutes  Harrie Foreman, MD 02/17/2016, 4:07 AM

## 2016-02-17 NOTE — ED Notes (Signed)
Report given to floor RN. Pt taken to floor via stretcher. Vital signs stable prior to transport.  

## 2016-02-17 NOTE — Progress Notes (Addendum)
Pharmacy Antibiotic Note  Kimberly Rodgers is a 81 y.o. female admitted on 02/17/2016 with pneumonia.  Pharmacy has been consulted for vancomycin and Levaquin dosing.  Plan: DW 54.3kg  Vd 38L kei 0.039 hr-1  T1/2 18 hours Vancomycin 1 gram q 24 hours ordered with stacked dosing. Level before 5th dose. Goal trough 15-20.  Levaquin 750 mg q 48 hours ordered.  Height: 5' (152.4 cm) Weight: 119 lb 9.6 oz (54.3 kg) IBW/kg (Calculated) : 45.5  Temp (24hrs), Avg:99.9 F (37.7 C), Min:98.8 F (37.1 C), Max:102 F (38.9 C)   Recent Labs Lab 02/17/16 0111 02/17/16 0417  WBC 7.7  --   CREATININE 0.54  --   LATICACIDVEN 2.0* 1.8    Estimated Creatinine Clearance: 41.6 mL/min (by C-G formula based on SCr of 0.54 mg/dL).    Allergies  Allergen Reactions  . Codeine Other (See Comments)    Reaction: unknown  . Penicillins Other (See Comments)    Reaction: unknown  . Pregabalin Other (See Comments)    Reaction: unknown  . Wellbutrin [Bupropion] Other (See Comments)    Reaction: unknown    Antimicrobials this admission: Levaquin  >>  vancomycin  >>   Dose adjustments this admission:   Microbiology results: 10/20 BCx: pending 10/20 UCx: pending     10/20 CXR: bibasilar opacities  Thank you for allowing pharmacy to be a part of this patient's care.  Jhovanny Guinta S 02/17/2016 5:51 AM

## 2016-02-17 NOTE — ED Notes (Signed)
Admitting MD at bedside.

## 2016-02-17 NOTE — ED Triage Notes (Signed)
Pt arrived via [facility] from home where she lives with her daughter. Pt has hx of dementia, however daughter reports that she has had an increase in AMS over the course of the day. Daughter called EMS to have her evaluated and to see if she needed to be brought to the ER for evaluation. When the fire dept arrived pt's O2 sats on RA were 86%, on 2L pt has been 94% for EMS. Pt does have hx of COPD. Pt has developed a cough recently and daughter reported to EMS that she was sick earlier this week with respiratory symptoms.

## 2016-02-17 NOTE — Progress Notes (Signed)
[facility]   PATIENT NAME: Kimberly Rodgers    MR#:  [id number]  DATE OF BIRTH:  August 27, 1934  SUBJECTIVE:  CHIEF COMPLAINT:   Chief Complaint  Patient presents with  . Altered Mental Status   Afebrile. Poor historian. ON 2 L O2.  REVIEW OF SYSTEMS:    Review of Systems  Unable to perform ROS: Mental status change    DRUG ALLERGIES:   Allergies  Allergen Reactions  . Codeine Other (See Comments)    Reaction: unknown  . Penicillins Other (See Comments)    Reaction: unknown  . Pregabalin Other (See Comments)    Reaction: unknown  . Wellbutrin [Bupropion] Other (See Comments)    Reaction: unknown    VITALS:  Blood pressure (!) 168/48, pulse 82, temperature 98.7 F (37.1 C), temperature source Oral, resp. rate 18, height 5' (1.524 m), weight 54.3 kg (119 lb 9.6 oz), SpO2 98 %.  PHYSICAL EXAMINATION:   Physical Exam  GENERAL:  81 y.o.-year-old patient lying in the bed with no acute distress.  EYES: Pupils equal, round, reactive to light and accommodation. No scleral icterus. Extraocular muscles intact.  HEENT: Head atraumatic, normocephalic. Oropharynx and nasopharynx clear.  NECK:  Supple, no jugular venous distention. No thyroid enlargement, no tenderness.  LUNGS: Normal work of breathing. Mild ronchi left base CARDIOVASCULAR: S1, S2 normal. No murmurs, rubs, or gallops.  ABDOMEN: Soft, nontender, nondistended. Bowel sounds present. No organomegaly or mass.  EXTREMITIES: No cyanosis, clubbing or edema b/l.    NEUROLOGIC: Cranial nerves II through XII are intact. No focal Motor or sensory deficits b/l.   PSYCHIATRIC: The patient is alert and awake. Pleasantly confused SKIN: No obvious rash, lesion, or ulcer.   LABORATORY PANEL:   CBC  Recent Labs Lab 02/17/16 0111  WBC 7.7  HGB 14.2  HCT 42.8  PLT 156    ------------------------------------------------------------------------------------------------------------------ Chemistries   Recent Labs Lab 02/17/16 0111  NA 131*  K 3.6  CL 97*  CO2 26  GLUCOSE 140*  BUN 10  CREATININE 0.54  CALCIUM 9.1  AST 30  ALT 22  ALKPHOS 102  BILITOT 0.4   ------------------------------------------------------------------------------------------------------------------  Cardiac Enzymes  Recent Labs Lab 02/17/16 0111  TROPONINI <0.03   ------------------------------------------------------------------------------------------------------------------  RADIOLOGY:  Dg Chest Port 1 View  Result Date: 02/17/2016 CLINICAL DATA:  Acute onset of cough and decreased O2 saturation. Initial encounter. EXAM: PORTABLE CHEST 1 VIEW COMPARISON:  Chest radiograph performed 09/18/2013 FINDINGS: The lungs are well-aerated. Bibasilar airspace opacities raise concern for pneumonia. Mild vascular congestion is noted. There is no evidence of pleural effusion or pneumothorax. The cardiomediastinal silhouette is enlarged. The patient is status post median sternotomy, with evidence of prior CABG. No acute osseous abnormalities are seen. IMPRESSION: 1. Bibasilar airspace opacities raise concern for pneumonia. 2. Cardiomegaly and mild vascular congestion noted. Electronically Signed   By: Roanna RaiderJeffery  Chang M.D.   On: 02/17/2016 02:13     ASSESSMENT AND PLAN:   This is a 81 year old female admitted for sepsis and pneumonia area  * Sepsis with bilateral pneumonia and acute hypoxic resp failure Iv Abx. D/C vancomycin. No h/o MRSA. From home Wean O2 as tolerated Nebs PRN Cx pending  * Diabetes mellitus type 2: I have held the patient's metformin at this time. Sliding scale insulin while hospitalized. Check hemoglobin A1c.  * Dementia: The patient is a poor historian.  Continue Effexor and trazodone for mood stabilization * Coronary artery disease: Stable. Continue  aspirin *  DVT prophylaxis: Lovenox  All the records are reviewed and case discussed with Care Management/Social Worker. Management plans discussed with the patient, family and they are in agreement.  CODE STATUS: FULL  DVT Prophylaxis: SCDs  TOTAL TIME TAKING CARE OF THIS PATIENT: 30 minutes.   POSSIBLE D/C IN 1-2 DAYS, DEPENDING ON CLINICAL CONDITION.  Milagros Loll R M.D on 02/17/2016 at 10:20 PM  Between 7am to 6pm - Pager - [phone]  After 6pm go to [email or web address] - password [email or web address] [facility]  [facility]  Office  [phone]  CC: Primary care physician; Madelon Lips, MD  Note: This dictation was prepared with Dragon dictation along with smaller phrase technology. Any transcriptional errors that result from this process are unintentional.

## 2016-02-17 NOTE — Care Management Note (Signed)
Case Management Note  Patient Details  Name: Maryln ManuelVivian B Scroggs MRN: [id number] Date of Birth: [birth year]  Subjective/Objective:                  Spoke with  Marletta LorBUNKER,DONNA F  [phone]   She states that patient lives with her however patient has 3 children and they all make decisions for patient. Patient is bedbound (she cannot stand or walk) for 2 years. She is not on home O2. Lupita LeashDonna states patient has diagnosis of COPD. Patient may need EMS to home. [facility]CP [facility]. Patient has used several home health agencies. Daughter agrees with home health if medially necessary and insurance will pay for it. Patient is totally dependent on her daughter for ADLs. She plans to take patient home at discharge.  Action/Plan:   List of home health agencies and my contact number left with daughter. [facility] notified [facility] potential [facility] need. RN to assess need. RNCM to continue to follow.   Expected Discharge Date:                  Expected Discharge Plan:     In-House Referral:     Discharge planning Services  CM Consult  Post Acute Care Choice:  Home Health, Durable Medical Equipment Choice offered to:  Adult Children  DME Arranged:  Oxygen DME Agency:  [facility]  HH Arranged:  [facility] Agency:     Status of Service:  In process, will continue to follow  If discussed at Long Length of Stay Meetings, dates discussed:    Additional Comments:  Collie Siadngela Viviane Semidey, RN 02/17/2016, 10:57 AM

## 2016-02-17 NOTE — ED Provider Notes (Signed)
[facility] Emergency Department Provider Note   ____________________________________________   First MD Initiated Contact with Patient 02/17/16 0114     (approximate)  I have reviewed the triage vital signs and the nursing notes.   HISTORY  Chief Complaint Altered Mental Status  Limited by decreased LOC  HPI Kimberly Rodgers is a 81 y.o. female who presents to the ED from home via EMS with a chief complaint of decreased LOC. Patient has a history of dementia, diabetes, COPD whose daughter reports that patient has had decreased LOC over the course of the day. Daughter was sick last week with viral type symptoms. Room air saturations on EMS arrival were initially 86%. Daughter reports symptoms of cough. Rest of the history is limited at this time secondary to patient's decreased LOC.   Past Medical History:  Diagnosis Date  . Coronary artery disease   . Diabetes mellitus without complication (HCC)   . Hyperlipidemia   . Hypertension   . Hypoxemia   . Memory loss   . Personal history of fall     Patient Active Problem List   Diagnosis Date Noted  . Sepsis (HCC) 02/17/2016  . Syncope 06/11/2013  . CAD (coronary artery disease) of artery bypass graft 06/11/2013  . Bradycardia 06/11/2013  . Falls 06/11/2013  . Diabetes type 2, controlled (HCC) 06/11/2013  . Osteoarthritis (arthritis due to wear and tear of joints) 06/11/2013  . Smoker 06/11/2013  . COPD (chronic obstructive pulmonary disease) with emphysema (HCC) 06/11/2013    Past Surgical History:  Procedure Laterality Date  . CARDIAC CATHETERIZATION  02/16/2009  . CATARACT EXTRACTION, BILATERAL    . CORONARY ARTERY BYPASS GRAFT  1998   CABG x 3 @ [facility]  . GALLBLADDER SURGERY    . IMPLANTABLE CONTACT LENS IMPLANTATION    . TONSILLECTOMY    . TOTAL VAGINAL HYSTERECTOMY      Prior to Admission medications   Medication Sig Start Date End Date Taking? Authorizing Provider  aspirin  EC 81 MG tablet Take 81 mg by mouth daily.   Yes Historical Provider, MD  metFORMIN (GLUCOPHAGE) 500 MG tablet Take 500 mg by mouth daily with breakfast.   Yes Historical Provider, MD  traZODone (DESYREL) 100 MG tablet Take 100 mg by mouth at bedtime.  06/08/13  Yes Historical Provider, MD  venlafaxine XR (EFFEXOR-XR) 37.5 MG 24 hr capsule Take 37.5 mg by mouth daily with breakfast.  05/12/13  Yes Historical Provider, MD    Allergies Codeine; Penicillins; Pregabalin; and Wellbutrin [bupropion]  Family History  Problem Relation Age of Onset  . Hyperlipidemia Sister     Social History Social History  Substance Use Topics  . Smoking status: Current Every Day Smoker    Packs/day: 0.25    Years: 61.00    Types: Cigarettes  . Smokeless tobacco: Not on file  . Alcohol use No    Review of Systems  Constitutional: No fever/chills. Eyes: No visual changes. ENT: No sore throat. Cardiovascular: Denies chest pain. Respiratory: Positive for cough. Denies shortness of breath. Gastrointestinal: No abdominal pain.  No nausea, no vomiting.  No diarrhea.  No constipation. Genitourinary: Negative for dysuria. Musculoskeletal: Negative for back pain. Skin: Negative for rash. Neurological: Negative for headaches, focal weakness or numbness.  Limited by decreased LOC; 10-point ROS otherwise negative.  ____________________________________________   PHYSICAL EXAM:  VITAL SIGNS: ED Triage Vitals [02/17/16 0105]  Enc Vitals Group     BP (!) 156/66  Pulse Rate (!) 105     Resp (!) 25     Temp      Temp src      SpO2 (!) 89 %     Weight 115 lb 4.8 oz (52.3 kg)     Height      Head Circumference      Peak Flow      Pain Score      Pain Loc      Pain Edu?      Excl. in GC?     Constitutional: Alert and oriented to self only. Frail appearing and in mild acute distress. Eyes: Conjunctivae are normal. PERRL. EOMI. Arcus senilis. Head: Atraumatic. Nose: No  congestion/rhinnorhea. Mouth/Throat: Mucous membranes are moist.  Oropharynx non-erythematous. Neck: No stridor.  Supple neck without meningismus. Cardiovascular: Tachycardic rate, regular rhythm. Grossly normal heart sounds.  Good peripheral circulation. Respiratory: Increased respiratory effort.  No retractions. Lungs with scattered rhonchi. Gastrointestinal: Soft and nontender. No distention. No abdominal bruits. No CVA tenderness. Musculoskeletal: No lower extremity tenderness nor edema.  No joint effusions. Neurologic:  Normal speech and language. No gross focal neurologic deficits are appreciated.  Skin:  Skin is hot, dry and intact. No rash noted. No petechiae. Psychiatric: Mood and affect are normal. Speech and behavior are normal.  ____________________________________________   LABS (all labs ordered are listed, but only abnormal results are displayed)  Labs Reviewed  COMPREHENSIVE METABOLIC PANEL - Abnormal; Notable for the following:       Result Value   Sodium 131 (*)    Chloride 97 (*)    Glucose, Bld 140 (*)    All other components within normal limits  LACTIC ACID, PLASMA - Abnormal; Notable for the following:    Lactic Acid, Venous 2.0 (*)    All other components within normal limits  URINALYSIS COMPLETEWITH MICROSCOPIC ([facility] ONLY) - Abnormal; Notable for the following:    Color, Urine YELLOW (*)    APPearance CLEAR (*)    All other components within normal limits  CULTURE, BLOOD (ROUTINE X 2)  CULTURE, BLOOD (ROUTINE X 2)  URINE CULTURE  CBC WITH DIFFERENTIAL/PLATELET  LACTIC ACID, PLASMA  TROPONIN I  INFLUENZA PANEL BY PCR (TYPE A & B, H1N1)  TSH  HEMOGLOBIN A1C   ____________________________________________  EKG  ED ECG REPORT I, Larraine Argo J, the attending physician, personally viewed and interpreted this ECG.   Date: 02/17/2016  EKG Time: 0107  Rate: 103  Rhythm: sinus tachycardia  Axis: Normal  Intervals:none  ST&T Change:  Nonspecific  ____________________________________________  RADIOLOGY  Portable chest x-ray (viewed by me, interpreted per Dr. Cherly Hensen): 1. Bibasilar airspace opacities raise concern for pneumonia.  2. Cardiomegaly and mild vascular congestion noted.   ____________________________________________   PROCEDURES  Procedure(s) performed: None  Procedures  Critical Care performed: Yes, see critical care note(s)  CRITICAL CARE Performed by: Irean Hong   Total critical care time: 30 minutes  Critical care time was exclusive of separately billable procedures and treating other patients.  Critical care was necessary to treat or prevent imminent or life-threatening deterioration.  Critical care was time spent personally by me on the following activities: development of treatment plan with patient and/or surrogate as well as nursing, discussions with consultants, evaluation of patient's response to treatment, examination of patient, obtaining history from patient or surrogate, ordering and performing treatments and interventions, ordering and review of laboratory studies, ordering and review of radiographic studies, pulse oximetry and re-evaluation of patient's condition. ____________________________________________  INITIAL IMPRESSION / ASSESSMENT AND PLAN / ED COURSE  Pertinent labs & imaging results that were available during my care of the patient were reviewed by me and considered in my medical decision making (see chart for details).  81 year old female who presents with decreased LOC. Rectal temperature 102F, room air saturations 88%. On 2 L nasal cannula patient is 94%. ED code sepsis initiated on patient's arrival.  Clinical Course     ____________________________________________   FINAL CLINICAL IMPRESSION(S) / ED DIAGNOSES  Final diagnoses:  Altered mental status, unspecified altered mental status type  Fever, unspecified fever cause  Sepsis, due to unspecified  organism (HCC)  Hypoxia  Community acquired pneumonia, unspecified laterality      NEW MEDICATIONS STARTED DURING THIS VISIT:  Current Discharge Medication List       Note:  This document was prepared using Dragon voice recognition software and may include unintentional dictation errors.    Irean HongJade J Magdalina Whitehead, MD 02/17/16 [id number]

## 2016-02-18 LAB — GLUCOSE, CAPILLARY
Glucose-Capillary: 96 mg/dL (ref 65–99)
Glucose-Capillary: 125 mg/dL — ABNORMAL HIGH (ref 65–99)
Glucose-Capillary: 157 mg/dL — ABNORMAL HIGH (ref 65–99)
Glucose-Capillary: 123 mg/dL — ABNORMAL HIGH (ref 65–99)

## 2016-02-18 LAB — HEMOGLOBIN A1C
Hgb A1c MFr Bld: 6.4 % — ABNORMAL HIGH (ref 4.8–5.6)
Mean Plasma Glucose: 137 mg/dL

## 2016-02-18 LAB — URINE CULTURE: Culture: NO GROWTH

## 2016-02-18 MED ORDER — LEVOFLOXACIN 750 MG PO TABS
750.0000 mg | ORAL_TABLET | ORAL | Status: DC
  Administered 2016-02-19: 750 mg via ORAL
  Filled 2016-02-18: qty 1

## 2016-02-18 MED ORDER — BISACODYL 10 MG RE SUPP
10.0000 mg | Freq: Every day | RECTAL | Status: DC | PRN
  Administered 2016-02-18 – 2016-02-20 (×2): 10 mg via RECTAL
  Filled 2016-02-18 (×2): qty 1

## 2016-02-18 MED ORDER — IPRATROPIUM-ALBUTEROL 0.5-2.5 (3) MG/3ML IN SOLN
3.0000 mL | Freq: Four times a day (QID) | RESPIRATORY_TRACT | Status: DC
  Administered 2016-02-18 – 2016-02-19 (×5): 3 mL via RESPIRATORY_TRACT
  Filled 2016-02-18 (×5): qty 3

## 2016-02-18 MED ORDER — BUDESONIDE 0.25 MG/2ML IN SUSP
0.2500 mg | Freq: Two times a day (BID) | RESPIRATORY_TRACT | Status: DC
  Administered 2016-02-18 – 2016-02-20 (×4): 0.25 mg via RESPIRATORY_TRACT
  Filled 2016-02-18 (×4): qty 2

## 2016-02-18 NOTE — Progress Notes (Signed)
This Clinical research associatewriter received a phone call from pt's daughter, who stated that pt had not had a BM since Wednesday, and is dependent on suppositoried to be able to have Bm. This Clinical research associatewriter paged Dr Hilton SinclairWeiting, received verbal order for suppository prn for BM.

## 2016-02-18 NOTE — Progress Notes (Signed)
Pt has temp of 101.7. Received tylenol po.

## 2016-02-18 NOTE — Progress Notes (Addendum)
Patient ID: Kimberly Rodgers, female   DOB: Jun 19, 1934, 81 y.o.   MRN: [id number]  [employer] PROGRESS NOTE  JOYCELINE MAIORINO JXB:[id number] DOB: 1934/05/31 DOA: 02/17/2016 PCP: Madelon Lips, MD  HPI/Subjective: Patient is a poor historian secondary to dementia. One of her daughters at the bedside.  Objective: Vitals:   02/18/16 0725 02/18/16 1142  BP: (!) 172/60 (!) 179/64  Pulse: 82 87  Resp: 16 16  Temp: 99.4 F (37.4 C) 98.4 F (36.9 C)    Intake/Output Summary (Last 24 hours) at 02/18/16 1352 Last data filed at 02/18/16 1200  Gross per 24 hour  Intake          3165.42 ml  Output                0 ml  Net          3165.42 ml   Filed Weights   02/17/16 0105 02/17/16 0507 02/18/16 0505  Weight: 52.3 kg (115 lb 4.8 oz) 54.3 kg (119 lb 9.6 oz) 58 kg (127 lb 14.4 oz)    ROS: Review of Systems  Unable to perform ROS: Dementia   Exam: Physical Exam  HENT:  Nose: No mucosal edema.  Unable to look in her mouth today.  Eyes: Conjunctivae and lids are normal. Pupils are equal, round, and reactive to light.  Neck: No JVD present. Carotid bruit is not present. No edema present. No thyroid mass and no thyromegaly present.  Cardiovascular: S1 normal and S2 normal.  Exam reveals no gallop.   Murmur heard.  Systolic murmur is present with a grade of 2/6  Pulses:      Dorsalis pedis pulses are 2+ on the right side, and 2+ on the left side.  Respiratory: No respiratory distress. She has decreased breath sounds in the right middle field, the right lower field, the left middle field and the left lower field. She has no wheezes. She has rhonchi in the right lower field and the left lower field. She has no rales.  GI: Soft. Bowel sounds are normal. There is no tenderness.  Musculoskeletal:       Right ankle: She exhibits swelling.       Left ankle: She exhibits swelling.  Lymphadenopathy:    She has no cervical adenopathy.  Neurological: She is alert.  Skin: Skin is warm.  No rash noted. Nails show no clubbing.  Psychiatric: She has a normal mood and affect.      Data Reviewed: Basic Metabolic Panel:  Recent Labs Lab 02/17/16 0111  NA 131*  K 3.6  CL 97*  CO2 26  GLUCOSE 140*  BUN 10  CREATININE 0.54  CALCIUM 9.1   Liver Function Tests:  Recent Labs Lab 02/17/16 0111  AST 30  ALT 22  ALKPHOS 102  BILITOT 0.4  PROT 7.1  ALBUMIN 3.7   CBC:  Recent Labs Lab 02/17/16 0111  WBC 7.7  NEUTROABS 5.4  HGB 14.2  HCT 42.8  MCV 87.2  PLT 156   Cardiac Enzymes:  Recent Labs Lab 02/17/16 0111  TROPONINI <0.03    CBG:  Recent Labs Lab 02/17/16 1116 02/17/16 1655 02/17/16 2057 02/18/16 0726 02/18/16 1144  GLUCAP 105* 94 98 96 125*    Recent Results (from the past 240 hour(s))  Blood Culture (routine x 2)     Status: None (Preliminary result)   Collection Time: 02/17/16  1:11 AM  Result Value Ref Range Status   Specimen Description BLOOD RIGHT HAND  Final   Special Requests BOTTLES DRAWN AEROBIC AND ANAEROBIC 7ML  Final   Culture NO GROWTH 1 DAY  Final   Report Status PENDING  Incomplete  Blood Culture (routine x 2)     Status: None (Preliminary result)   Collection Time: 02/17/16  1:12 AM  Result Value Ref Range Status   Specimen Description BLOOD LEFT WRIST  Final   Special Requests BOTTLES DRAWN AEROBIC AND ANAEROBIC 7ML  Final   Culture NO GROWTH 1 DAY  Final   Report Status PENDING  Incomplete  Urine culture     Status: None   Collection Time: 02/17/16  1:14 AM  Result Value Ref Range Status   Specimen Description URINE, CLEAN CATCH  Final   Special Requests NONE  Final   Culture NO GROWTH [facility]   Final   Report Status 02/18/2016 FINAL  Final     Studies: Dg Chest Port 1 View  Result Date: 02/17/2016 CLINICAL DATA:  Acute onset of cough and decreased O2 saturation. Initial encounter. EXAM: PORTABLE CHEST 1 VIEW COMPARISON:  Chest radiograph performed 09/18/2013 FINDINGS: The  lungs are well-aerated. Bibasilar airspace opacities raise concern for pneumonia. Mild vascular congestion is noted. There is no evidence of pleural effusion or pneumothorax. The cardiomediastinal silhouette is enlarged. The patient is status post median sternotomy, with evidence of prior CABG. No acute osseous abnormalities are seen. IMPRESSION: 1. Bibasilar airspace opacities raise concern for pneumonia. 2. Cardiomegaly and mild vascular congestion noted. Electronically Signed   By: Roanna RaiderJeffery  Chang M.D.   On: 02/17/2016 02:13    Scheduled Meds: . aspirin EC  81 mg Oral Daily  . docusate sodium  100 mg Oral BID  . enoxaparin (LOVENOX) injection  40 mg Subcutaneous Q24H  . insulin aspart  0-5 Units Subcutaneous QHS  . insulin aspart  0-9 Units Subcutaneous TID WC  . [START ON 02/19/2016] levofloxacin  750 mg Oral Q48H  . sodium chloride flush  3 mL Intravenous Q12H  . traZODone  100 mg Oral QHS  . venlafaxine XR  37.5 mg Oral Q breakfast   Continuous Infusions: . sodium chloride Stopped (02/18/16 1225)    Assessment/Plan:  1. Clinical sepsis with bilateral pneumonia. Patient is on Levaquin dosed by the pharmacy. 2. Acute hypoxic respiratory failure. Patient currently on 2 L nasal cannula. We'll try to taper off oxygen. Add budesonide and DuoNeb nebulizer solution. 3. Type 2 diabetes mellitus. All sugars have been good off metformin at this point. 4. Dementia without behavioral disturbance. Continue psychiatric medications 5. Hyponatremia. Stop IV fluids and check a BMP tomorrow morning.  Code Status:     Code Status Orders        Start     Ordered   02/17/16 0540  Full code  Continuous     02/17/16 0539    Code Status History    Date Active Date Inactive Code Status Order ID Comments User Context   This patient has a current code status but no historical code status.    Advance Directive Documentation   Flowsheet Row Most Recent Value  Type of Advance Directive  Healthcare  Power of Attorney  Pre-existing out of facility DNR order (yellow form or pink MOST form)  No data  "MOST" Form in Place?  No data     Family Communication: Spoke with daughter at the bedside Disposition Plan: Potentially home tomorrow  Antibiotics:  Levaquin  Time spent: 25 minutes  WainwrightWIETING, GruverRICHARD  [employer]

## 2016-02-18 NOTE — Progress Notes (Signed)
PHARMACIST - PHYSICIAN COMMUNICATION DR:   Dolores FrameSung CONCERNING: Antibiotic IV to Oral Route Change Policy  RECOMMENDATION: This patient is receiving Levofloxacin  by the intravenous route.  Based on criteria approved by the Pharmacy and Therapeutics Committee, the antibiotic(s) is/are being converted to the equivalent oral dose form(s).   DESCRIPTION: These criteria include:  Patient being treated for a respiratory tract infection, urinary tract infection, cellulitis or clostridium difficile associated diarrhea if on metronidazole  The patient is not neutropenic and does not exhibit a GI malabsorption state  The patient is eating (either orally or via tube) and/or has been taking other orally administered medications for a least 24 hours  The patient is improving clinically and has a Tmax < 100.5  If you have questions about this conversion, please contact the Pharmacy Department  []   [phone]( [phone] )  [facility] [x]   [phone]( [phone] )  [facility] []   [phone]  [facility] []   [phone]( [phone] )  [facility] []   [phone]( [phone] )  [facility]   Demetrius Charityeldrin D. Kolleen O[facility]ner, PharmD, BCPS  Clinical Pharmacist

## 2016-02-18 NOTE — Plan of Care (Signed)
Problem: Bowel/Gastric: Goal: Will not experience complications related to bowel motility Outcome: Progressing Pt continues to progress toward discharge. Per family, pt's mentation is "off", but pt follows commands slowly, is cooperative, oriented to self and place. Pt remains free of falls/injury this shift.

## 2016-02-18 NOTE — Progress Notes (Signed)
Shift assessment completed. Pt easily awakened, is oriented to self and place. Pt is cooperative, there is a delay between questions asked and answer given, but answer is appropriate. Skin is warm and ry, o2 on at @[facility] , Lungs with expiratory wheezes to bilat bases, respirations unlabored. S1S2 heard, telebox in place. Abdomen is soft, bs hypoactive. Pt is wearing incontinence brief.ppp, no edema noted, SCD's intact to bilat legs with heels elevated. PIV #22 intact to l arm with ns infusing at 17625mls/hr, site is free of redness and swelling. This Clinical research associatewriter assisted pt with breakfast, ate little. Hob is elevated, srx2, call bell in reach.

## 2016-02-19 LAB — GLUCOSE, CAPILLARY
Glucose-Capillary: 114 mg/dL — ABNORMAL HIGH (ref 65–99)
Glucose-Capillary: 146 mg/dL — ABNORMAL HIGH (ref 65–99)
Glucose-Capillary: 137 mg/dL — ABNORMAL HIGH (ref 65–99)
Glucose-Capillary: 96 mg/dL (ref 65–99)

## 2016-02-19 LAB — BASIC METABOLIC PANEL
Anion gap: 6 (ref 5–15)
BUN: 9 mg/dL (ref 6–20)
CALCIUM: 8.6 mg/dL — AB (ref 8.9–10.3)
CHLORIDE: 105 mmol/L (ref 101–111)
CO2: 26 mmol/L (ref 22–32)
CREATININE: 0.36 mg/dL — AB (ref 0.44–1.00)
Glucose, Bld: 118 mg/dL — ABNORMAL HIGH (ref 65–99)
Potassium: 3.1 mmol/L — ABNORMAL LOW (ref 3.5–5.1)
SODIUM: 137 mmol/L (ref 135–145)

## 2016-02-19 MED ORDER — POTASSIUM CHLORIDE CRYS ER 20 MEQ PO TBCR
40.0000 meq | EXTENDED_RELEASE_TABLET | Freq: Two times a day (BID) | ORAL | Status: AC
  Administered 2016-02-19 (×2): 40 meq via ORAL
  Filled 2016-02-19 (×2): qty 2

## 2016-02-19 MED ORDER — IPRATROPIUM-ALBUTEROL 0.5-2.5 (3) MG/3ML IN SOLN
3.0000 mL | Freq: Three times a day (TID) | RESPIRATORY_TRACT | Status: DC
  Administered 2016-02-19 – 2016-02-20 (×2): 3 mL via RESPIRATORY_TRACT
  Filled 2016-02-19 (×2): qty 3

## 2016-02-19 NOTE — Care Management Important Message (Signed)
Important Message  Patient Details  Name: Kimberly ManuelVivian B Pulido MRN: [id number] Date of Birth: Mar 08, 1935   Medicare Important Message Given:  Yes    Lawerance Sabalebbie Ree Alcalde, RN 02/19/2016, 9:35 AM

## 2016-02-19 NOTE — Progress Notes (Signed)
Patient ID: Kimberly Rodgers, female   DOB: 23-Dec-1934, 81 y.o.   MRN: [id number]  [employer] PROGRESS NOTE  Kimberly Rodgers:[id number] DOB: 1934/11/14 DOA: 02/17/2016 PCP: Kimberly Lips, MD  HPI/Subjective: Patient is a poor historian secondary to dementia. One of her daughters at the bedside.  pt is eating good today. As per daughter- she looks better.  Objective: Vitals:   02/19/16 0745 02/19/16 1123  BP: (!) 168/62 (!) 132/46  Pulse:  (!) 106  Resp:  18  Temp:  99.5 F (37.5 C)    Intake/Output Summary (Last 24 hours) at 02/19/16 1335 Last data filed at 02/19/16 0930  Gross per 24 hour  Intake              240 ml  Output                0 ml  Net              240 ml   Filed Weights   02/17/16 0507 02/18/16 0505 02/19/16 0500  Weight: 54.3 kg (119 lb 9.6 oz) 58 kg (127 lb 14.4 oz) 57.7 kg (127 lb 4.8 oz)    ROS: Review of Systems  Unable to perform ROS: Dementia   Exam: Physical Exam  HENT:  Nose: No mucosal edema.  Unable to look in her mouth today.  Eyes: Conjunctivae and lids are normal. Pupils are equal, round, and reactive to light.  Neck: No JVD present. Carotid bruit is not present. No edema present. No thyroid mass and no thyromegaly present.  Cardiovascular: S1 normal and S2 normal.  Exam reveals no gallop.   Murmur heard.  Systolic murmur is present with a grade of 2/6  Pulses:      Dorsalis pedis pulses are 2+ on the right side, and 2+ on the left side.  Respiratory: No respiratory distress. She has decreased breath sounds in the right middle field, the right lower field, the left middle field and the left lower field. She has no wheezes. She has rhonchi in the right lower field and the left lower field. She has no rales.  GI: Soft. Bowel sounds are normal. There is no tenderness.  Musculoskeletal:       Right ankle: She exhibits swelling.       Left ankle: She exhibits swelling.  Lymphadenopathy:    She has no cervical adenopathy.   Neurological: She is alert.  Skin: Skin is warm. No rash noted. Nails show no clubbing.  Psychiatric: She has a normal mood and affect.      Data Reviewed: Basic Metabolic Panel:  Recent Labs Lab 02/17/16 0111 02/19/16 0443  NA 131* 137  K 3.6 3.1*  CL 97* 105  CO2 26 26  GLUCOSE 140* 118*  BUN 10 9  CREATININE 0.54 0.36*  CALCIUM 9.1 8.6*   Liver Function Tests:  Recent Labs Lab 02/17/16 0111  AST 30  ALT 22  ALKPHOS 102  BILITOT 0.4  PROT 7.1  ALBUMIN 3.7   CBC:  Recent Labs Lab 02/17/16 0111  WBC 7.7  NEUTROABS 5.4  HGB 14.2  HCT 42.8  MCV 87.2  PLT 156   Cardiac Enzymes:  Recent Labs Lab 02/17/16 0111  TROPONINI <0.03    CBG:  Recent Labs Lab 02/18/16 1144 02/18/16 1619 02/18/16 2128 02/19/16 0738 02/19/16 1124  GLUCAP 125* 157* 123* 114* 146*    Recent Results (from the past 240 hour(s))  Blood Culture (routine x 2)  Status: None (Preliminary result)   Collection Time: 02/17/16  1:11 AM  Result Value Ref Range Status   Specimen Description BLOOD RIGHT HAND  Final   Special Requests BOTTLES DRAWN AEROBIC AND ANAEROBIC 7ML  Final   Culture NO GROWTH 2 DAYS  Final   Report Status PENDING  Incomplete  Blood Culture (routine x 2)     Status: None (Preliminary result)   Collection Time: 02/17/16  1:12 AM  Result Value Ref Range Status   Specimen Description BLOOD LEFT WRIST  Final   Special Requests BOTTLES DRAWN AEROBIC AND ANAEROBIC 7ML  Final   Culture NO GROWTH 2 DAYS  Final   Report Status PENDING  Incomplete  Urine culture     Status: None   Collection Time: 02/17/16  1:14 AM  Result Value Ref Range Status   Specimen Description URINE, CLEAN CATCH  Final   Special Requests NONE  Final   Culture NO GROWTH [facility]   Final   Report Status 02/18/2016 FINAL  Final     Studies: No results found.  Scheduled Meds: . aspirin EC  81 mg Oral Daily  . budesonide (PULMICORT) nebulizer solution   0.25 mg Nebulization BID  . docusate sodium  100 mg Oral BID  . enoxaparin (LOVENOX) injection  40 mg Subcutaneous Q24H  . insulin aspart  0-5 Units Subcutaneous QHS  . insulin aspart  0-9 Units Subcutaneous TID WC  . ipratropium-albuterol  3 mL Nebulization Q6H  . levofloxacin  750 mg Oral Q48H  . potassium chloride  40 mEq Oral BID  . sodium chloride flush  3 mL Intravenous Q12H  . traZODone  100 mg Oral QHS  . venlafaxine XR  37.5 mg Oral Q breakfast   Continuous Infusions:    Assessment/Plan:  1. Clinical sepsis with bilateral pneumonia. Patient is on Levaquin dosed by the pharmacy. 2.   Improving, may d/c tomorrow. 3. Acute hypoxic respiratory failure. Patient currently on 2 L nasal cannula. We'll try to taper off oxygen. Add budesonide and DuoNeb nebulizer solution. 4. Type 2 diabetes mellitus. All sugars have been good off metformin at this point. 5. Dementia without behavioral disturbance. Continue psychiatric medications 6. Hyponatremia. Normal now. 7. Hypokalemia- replace oral.  Code Status:     Code Status Orders        Start     Ordered   02/17/16 0540  Full code  Continuous     02/17/16 0539    Code Status History    Date Active Date Inactive Code Status Order ID Comments User Context   This patient has a current code status but no historical code status.    Advance Directive Documentation   Flowsheet Row Most Recent Value  Type of Advance Directive  Healthcare Power of Attorney  Pre-existing out of facility DNR order (yellow form or pink MOST form)  No data  "MOST" Form in Place?  No data     Family Communication: Spoke with daughter at the bedside Disposition Plan: Potentially home tomorrow  Antibiotics:  Levaquin  Time spent: 30 minutes  Brittne Kawasaki, [employer] Oil CorporationVAIBHAVKUMAR  [employer]

## 2016-02-20 LAB — BASIC METABOLIC PANEL
ANION GAP: 4 — AB (ref 5–15)
BUN: 11 mg/dL (ref 6–20)
CALCIUM: 9 mg/dL (ref 8.9–10.3)
CO2: 27 mmol/L (ref 22–32)
Chloride: 108 mmol/L (ref 101–111)
Creatinine, Ser: 0.42 mg/dL — ABNORMAL LOW (ref 0.44–1.00)
Glucose, Bld: 98 mg/dL (ref 65–99)
Potassium: 4 mmol/L (ref 3.5–5.1)
SODIUM: 139 mmol/L (ref 135–145)

## 2016-02-20 LAB — GLUCOSE, CAPILLARY
Glucose-Capillary: 107 mg/dL — ABNORMAL HIGH (ref 65–99)
Glucose-Capillary: 129 mg/dL — ABNORMAL HIGH (ref 65–99)

## 2016-02-20 MED ORDER — LEVOFLOXACIN 750 MG PO TABS: 750.0000 mg | ORAL_TABLET | ORAL | 0 refills | Status: DC

## 2016-02-20 MED ORDER — ALBUTEROL SULFATE HFA 108 (90 BASE) MCG/ACT IN AERS: 2.0000 | INHALATION_SPRAY | Freq: Four times a day (QID) | RESPIRATORY_TRACT | 0 refills | Status: DC | PRN

## 2016-02-20 NOTE — Progress Notes (Signed)
Pharmacy Antibiotic Note  Kimberly Rodgers is a 81 y.o. female admitted on 02/17/2016 with pneumonia (CAP).  Pharmacy has been consulted for vancomycin and Levaquin dosing.  Plan: Will continue Levaquin 750 mg PO q 48 hours ordered.  Height: 5' (152.4 cm) Weight: 125 lb 1.6 oz (56.7 kg) IBW/kg (Calculated) : 45.5  Temp (24hrs), Avg:99.3 F (37.4 C), Min:99 F (37.2 C), Max:99.5 F (37.5 C)   Recent Labs Lab 02/17/16 0111 02/17/16 0417 02/19/16 0443 02/20/16 0403  WBC 7.7  --   --   --   CREATININE 0.54  --  0.36* 0.42*  LATICACIDVEN 2.0* 1.8  --   --     Estimated Creatinine Clearance: 45.7 mL/min (by C-G formula based on SCr of 0.42 mg/dL (L)).    Allergies  Allergen Reactions  . Codeine Other (See Comments)    Reaction: unknown  . Penicillins Other (See Comments)    Reaction: unknown  . Pregabalin Other (See Comments)    Reaction: unknown  . Wellbutrin [Bupropion] Other (See Comments)    Reaction: unknown    Antimicrobials this admission: Levaquin 10/20 >>  vancomycin  10/20 >> 10/20   Dose adjustments this admission:   Microbiology results: 10/20 ZOX:WRUEBCx:NGTD 10/20 UCx:NG    10/20 CXR: bibasilar opacities  Thank you for allowing pharmacy to be a part of this patient's care.  Marty HeckWang, Issachar Broady L 02/20/2016 11:27 AM

## 2016-02-20 NOTE — Care Management (Addendum)
LM for daughter, Armond HangDonna Bunker, [phone]. She has no home health agency preference. Referral to [facility] for SN, PT, HHA and SW. Has qualifying O2 sats . O2 ordered from [facility].  No DME needs. Daughter doesn't want to pay a ambulance bill so she plans to take her mother by car today.

## 2016-02-20 NOTE — Discharge Summary (Addendum)
[facility] [place] at [facility]   PATIENT NAME: Kimberly Rodgers    MR#:  [id number]  DATE OF BIRTH:  Jun 13, 1934  DATE OF ADMISSION:  02/17/2016 ADMITTING PHYSICIAN: Arnaldo NatalMichael S Diamond, MD  DATE OF DISCHARGE: 02/20/2016  PRIMARY CARE PHYSICIAN: Madelon LipsBHAGALIA, PARUL P, MD    ADMISSION DIAGNOSIS:  Hypoxia [R09.02] Sepsis, due to unspecified organism (HCC) [A41.9] Fever, unspecified fever cause [R50.9] Altered mental status, unspecified altered mental status type [R41.82] Community acquired pneumonia, unspecified laterality [J18.9]  DISCHARGE DIAGNOSIS:  Active Problems:   Sepsis (HCC)    pneumonia    SECONDARY DIAGNOSIS:   Past Medical History:  Diagnosis Date  . Coronary artery disease   . Diabetes mellitus without complication (HCC)   . Hyperlipidemia   . Hypertension   . Hypoxemia   . Memory loss   . Personal history of fall     HOSPITAL COURSE:   1. Clinical sepsis with bilateral pneumonia. Patient is on Levaquin dosed by the pharmacy. 2.   Improving, d/c today with oral course. 3. Acute hypoxic respiratory failure. Patient currently on 2 L nasal cannula. We'll try to taper off oxygen. Added budesonide and DuoNeb nebulizer solution. Pt was not able to come off the oxygen and stayed hypoxic on room air. Found to have Emphysematous changes on CT chest in past, she have COPD- D/c with home o2. 4. Type 2 diabetes mellitus. All sugars have been good off metformin at this point. 5. Dementia without behavioral disturbance. Continue psychiatric medications 6. Hyponatremia. Normal now. 7. Hypokalemia- replace oral. 8.  COPD, Pt had emphysematous changes on CT chest in 2014- Advise to follow with PMD and with pulmonary clinic for further diagnostic for this. Need supplemental oxygen for now.  DISCHARGE CONDITIONS:   Stable.  CONSULTS OBTAINED:    DRUG ALLERGIES:   Allergies  Allergen Reactions  . Codeine Other (See Comments)    Reaction:  unknown  . Penicillins Other (See Comments)    Reaction: unknown  . Pregabalin Other (See Comments)    Reaction: unknown  . Wellbutrin [Bupropion] Other (See Comments)    Reaction: unknown    DISCHARGE MEDICATIONS:   Current Discharge Medication List    START taking these medications   Details  albuterol (PROVENTIL HFA;VENTOLIN HFA) 108 (90 Base) MCG/ACT inhaler Inhale 2 puffs into the lungs every 6 (six) hours as needed for wheezing or shortness of breath. Qty: 1 Inhaler, Refills: 0    levofloxacin (LEVAQUIN) 750 MG tablet Take 1 tablet (750 mg total) by mouth every other day. Qty: 2 tablet, Refills: 0      CONTINUE these medications which have NOT CHANGED   Details  aspirin EC 81 MG tablet Take 81 mg by mouth daily.    metFORMIN (GLUCOPHAGE) 500 MG tablet Take 500 mg by mouth daily with breakfast.    traZODone (DESYREL) 100 MG tablet Take 100 mg by mouth at bedtime.     venlafaxine XR (EFFEXOR-XR) 37.5 MG 24 hr capsule Take 37.5 mg by mouth daily with breakfast.          DISCHARGE INSTRUCTIONS:    Follow with PMD in 1-2 weeks.  If you experience worsening of your admission symptoms, develop shortness of breath, life threatening emergency, suicidal or homicidal thoughts you must seek medical attention immediately by calling 911 or calling your MD immediately  if symptoms less severe.  You Must read complete instructions/literature along with all the possible adverse reactions/side effects for all the Medicines you  take and that have been prescribed to you. Take any new Medicines after you have completely understood and accept all the possible adverse reactions/side effects.   Please note  You were cared for by a hospitalist during your hospital stay. If you have any questions about your discharge medications or the care you received while you were in the hospital after you are discharged, you can call the unit and asked to speak with the hospitalist on call if the  hospitalist that took care of you is not available. Once you are discharged, your primary care physician will handle any further medical issues. Please note that NO REFILLS for any discharge medications will be authorized once you are discharged, as it is imperative that you return to your primary care physician (or establish a relationship with a primary care physician if you do not have one) for your aftercare needs so that they can reassess your need for medications and monitor your lab values.    Today   CHIEF COMPLAINT:   Chief Complaint  Patient presents with  . Altered Mental Status    HISTORY OF PRESENT ILLNESS:  Kimberly Rodgers  is a 81 y.o. female with a known history of diabetes, coronary artery disease and dementia presents to the emergency department due to the concern of her daughter for decreased responsiveness. The patient's daughter is her primary caregiver and states that her mom had a "glassy appearance in her eyes" and was not responding verbally as she usually does. She states that the patient also lost continence of urine which also had a very strong odor. Upon EMS arrival the patient felt warm to touch. In the emergency [place] she was found to have a rectal temperature of 102.31F as well as tachycardia. Chest x-ray revealed pneumonia, and after initiation of sepsis protocol the emergency department staff called for admission.   VITAL SIGNS:  Blood pressure (!) 159/66, pulse 79, temperature 99 F (37.2 C), temperature source Oral, resp. rate 17, height 5' (1.524 m), weight 56.7 kg (125 lb 1.6 oz), SpO2 97 %.  I/O:   Intake/Output Summary (Last 24 hours) at 02/20/16 1145 Last data filed at 02/19/16 1815  Gross per 24 hour  Intake              480 ml  Output                0 ml  Net              480 ml    PHYSICAL EXAMINATION:   HENT:  Nose: No mucosal edema.  Unable to look in her mouth today.  Eyes: Conjunctivae and lids are normal. Pupils are equal, round, and  reactive to light.  Neck: No JVD present. Carotid bruit is not present. No edema present. No thyroid mass and no thyromegaly present.  Cardiovascular: S1 normal and S2 normal.  Exam reveals no gallop.   Murmur heard.  Systolic murmur is present with a grade of 2/6  Pulses:      Dorsalis pedis pulses are 2+ on the right side, and 2+ on the left side.  Respiratory: No respiratory distress. She has decreased breath sounds in the right middle field, the right lower field, the left middle field and the left lower field. She has no wheezes. She has rhonchi in the right lower field and the left lower field. She has no rales.  GI: Soft. Bowel sounds are normal. There is no tenderness.  Musculoskeletal:  Right ankle: She exhibits swelling.       Left ankle: She exhibits swelling.  Lymphadenopathy:    She has no cervical adenopathy.  Neurological: She is alert.  Skin: Skin is warm. No rash noted. Nails show no clubbing.  Psychiatric: She has a normal mood and affect.    DATA REVIEW:   CBC  Recent Labs Lab 02/17/16 0111  WBC 7.7  HGB 14.2  HCT 42.8  PLT 156    Chemistries   Recent Labs Lab 02/17/16 0111  02/20/16 0403  NA 131*  < > 139  K 3.6  < > 4.0  CL 97*  < > 108  CO2 26  < > 27  GLUCOSE 140*  < > 98  BUN 10  < > 11  CREATININE 0.54  < > 0.42*  CALCIUM 9.1  < > 9.0  AST 30  --   --   ALT 22  --   --   ALKPHOS 102  --   --   BILITOT 0.4  --   --   < > = values in this interval not displayed.  Cardiac Enzymes  Recent Labs Lab 02/17/16 0111  TROPONINI <0.03    Microbiology Results  Results for orders placed or performed during the hospital encounter of 02/17/16  Blood Culture (routine x 2)     Status: None (Preliminary result)   Collection Time: 02/17/16  1:11 AM  Result Value Ref Range Status   Specimen Description BLOOD RIGHT HAND  Final   Special Requests BOTTLES DRAWN AEROBIC AND ANAEROBIC  Final   Culture NO GROWTH 3 DAYS  Final   Report  Status PENDING  Incomplete  Blood Culture (routine x 2)     Status: None (Preliminary result)   Collection Time: 02/17/16  1:12 AM  Result Value Ref Range Status   Specimen Description BLOOD LEFT WRIST  Final   Special Requests BOTTLES DRAWN AEROBIC AND ANAEROBIC  Final   Culture NO GROWTH 3 DAYS  Final   Report Status PENDING  Incomplete  Urine culture     Status: None   Collection Time: 02/17/16  1:14 AM  Result Value Ref Range Status   Specimen Description URINE, CLEAN CATCH  Final   Special Requests NONE  Final   Culture NO GROWTH Performed at [facility]   Final   Report Status 02/18/2016 FINAL  Final    RADIOLOGY:  No results found.  EKG:   Orders placed or performed during the hospital encounter of 02/17/16  . EKG 12-Lead  . EKG 12-Lead      Management plans discussed with the patient, family and they are in agreement.  CODE STATUS:     Code Status Orders        Start     Ordered   02/17/16 0540  Full code  Continuous     02/17/16 0539    Code Status History    Date Active Date Inactive Code Status Order ID Comments User Context   This patient has a current code status but no historical code status.    Advance Directive Documentation   Flowsheet Row Most Recent Value  Type of Advance Directive  Healthcare Power of Attorney  Pre-existing out of facility DNR order (yellow form or pink MOST form)  No data  "MOST" Form in Place?  No data      TOTAL TIME TAKING CARE OF THIS PATIENT: 35 minutes.    Macklyn Glandon, Heath Gold  M.D on 02/20/2016 at 11:45 AM  Between 7am to 6pm - Pager - [phone]  After 6pm go to [email or web address] - password [email or web address]  [facility]  Office  [phone]  CC: Primary care physician; Madelon Lips, MD   Note: This dictation was prepared with Dragon dictation along with smaller phrase technology. Any transcriptional errors that result from this process are unintentional.

## 2016-02-20 NOTE — Progress Notes (Signed)
Pt being discharged home. PIV removed. Discharge instructions reviewed with pt daughter, all questions answered. Prescriptions given to pt to have filled at pharmacy. She is leaving with oxygen and HH which has been set up through [facility]. She is leaving with all her belongings, will be transported home via family.

## 2016-02-20 NOTE — Progress Notes (Signed)
SATURATION QUALIFICATIONS: (This note is used to comply with regulatory documentation for home oxygen)  Patient Saturations on Room Air at Rest = 84  Patient Saturations on Room Air while Ambulating =N/A, pt does not ambulate  Patient Saturations on 2 Liters of oxygen while at Rest = 94%  Please briefly explain why patient needs home oxygen:

## 2016-02-22 LAB — CULTURE, BLOOD (ROUTINE X 2)
CULTURE: NO GROWTH
Culture: NO GROWTH

## 2018-02-13 ENCOUNTER — Other Ambulatory Visit: Payer: Self-pay

## 2018-02-13 ENCOUNTER — Emergency Department: Payer: Medicare Other

## 2018-02-13 ENCOUNTER — Inpatient Hospital Stay
Admission: EM | Admit: 2018-02-13 | Discharge: 2018-02-16 | DRG: 194 | Disposition: A | Discharge status: Home / Self Care | Payer: Medicare Other | Source: Skilled Nursing Facility | Attending: Specialist | Admitting: Specialist

## 2018-02-13 ENCOUNTER — Encounter: Payer: Self-pay | Admitting: *Deleted

## 2018-02-13 DIAGNOSIS — R509 Fever, unspecified: Secondary | ICD-10-CM | POA: Diagnosis present

## 2018-02-13 DIAGNOSIS — E119 Type 2 diabetes mellitus without complications: Secondary | ICD-10-CM | POA: Diagnosis present

## 2018-02-13 DIAGNOSIS — Z7982 Long term (current) use of aspirin: Secondary | ICD-10-CM

## 2018-02-13 DIAGNOSIS — J439 Emphysema, unspecified: Secondary | ICD-10-CM | POA: Diagnosis present

## 2018-02-13 DIAGNOSIS — J181 Lobar pneumonia, unspecified organism: Principal | ICD-10-CM | POA: Diagnosis present

## 2018-02-13 DIAGNOSIS — I10 Essential (primary) hypertension: Secondary | ICD-10-CM | POA: Diagnosis present

## 2018-02-13 DIAGNOSIS — F0390 Unspecified dementia without behavioral disturbance: Secondary | ICD-10-CM | POA: Diagnosis present

## 2018-02-13 DIAGNOSIS — Z7984 Long term (current) use of oral hypoglycemic drugs: Secondary | ICD-10-CM

## 2018-02-13 DIAGNOSIS — Z8349 Family history of other endocrine, nutritional and metabolic diseases: Secondary | ICD-10-CM | POA: Diagnosis not present

## 2018-02-13 DIAGNOSIS — Z9089 Acquired absence of other organs: Secondary | ICD-10-CM

## 2018-02-13 DIAGNOSIS — Z87891 Personal history of nicotine dependence: Secondary | ICD-10-CM

## 2018-02-13 DIAGNOSIS — Z9842 Cataract extraction status, left eye: Secondary | ICD-10-CM | POA: Diagnosis not present

## 2018-02-13 DIAGNOSIS — A419 Sepsis, unspecified organism: Secondary | ICD-10-CM | POA: Diagnosis present

## 2018-02-13 DIAGNOSIS — Z8619 Personal history of other infectious and parasitic diseases: Secondary | ICD-10-CM | POA: Diagnosis not present

## 2018-02-13 DIAGNOSIS — L8990 Pressure ulcer of unspecified site, unspecified stage: Secondary | ICD-10-CM

## 2018-02-13 DIAGNOSIS — Z90710 Acquired absence of both cervix and uterus: Secondary | ICD-10-CM | POA: Diagnosis not present

## 2018-02-13 DIAGNOSIS — F329 Major depressive disorder, single episode, unspecified: Secondary | ICD-10-CM | POA: Diagnosis present

## 2018-02-13 DIAGNOSIS — Z7401 Bed confinement status: Secondary | ICD-10-CM | POA: Diagnosis not present

## 2018-02-13 DIAGNOSIS — Z880 Allergy status to penicillin: Secondary | ICD-10-CM | POA: Diagnosis not present

## 2018-02-13 DIAGNOSIS — J189 Pneumonia, unspecified organism: Secondary | ICD-10-CM

## 2018-02-13 DIAGNOSIS — Y95 Nosocomial condition: Secondary | ICD-10-CM | POA: Diagnosis present

## 2018-02-13 DIAGNOSIS — R1312 Dysphagia, oropharyngeal phase: Secondary | ICD-10-CM | POA: Diagnosis present

## 2018-02-13 DIAGNOSIS — Z961 Presence of intraocular lens: Secondary | ICD-10-CM | POA: Diagnosis present

## 2018-02-13 DIAGNOSIS — Z79899 Other long term (current) drug therapy: Secondary | ICD-10-CM

## 2018-02-13 DIAGNOSIS — Z885 Allergy status to narcotic agent status: Secondary | ICD-10-CM

## 2018-02-13 DIAGNOSIS — I25810 Atherosclerosis of coronary artery bypass graft(s) without angina pectoris: Secondary | ICD-10-CM | POA: Diagnosis present

## 2018-02-13 DIAGNOSIS — Z9841 Cataract extraction status, right eye: Secondary | ICD-10-CM

## 2018-02-13 DIAGNOSIS — I2510 Atherosclerotic heart disease of native coronary artery without angina pectoris: Secondary | ICD-10-CM | POA: Diagnosis present

## 2018-02-13 DIAGNOSIS — Z9181 History of falling: Secondary | ICD-10-CM

## 2018-02-13 DIAGNOSIS — R4182 Altered mental status, unspecified: Secondary | ICD-10-CM

## 2018-02-13 DIAGNOSIS — Z888 Allergy status to other drugs, medicaments and biological substances status: Secondary | ICD-10-CM

## 2018-02-13 HISTORY — DX: Chronic obstructive pulmonary disease, unspecified: J44.9

## 2018-02-13 LAB — URINALYSIS, ROUTINE W REFLEX MICROSCOPIC
Bacteria, UA: NONE SEEN
Bilirubin Urine: NEGATIVE
Hgb urine dipstick: NEGATIVE
KETONES UR: NEGATIVE mg/dL
Leukocytes, UA: NEGATIVE
NITRITE: NEGATIVE
Protein, ur: 100 mg/dL — AB
SPECIFIC GRAVITY, URINE: 1.02 (ref 1.005–1.030)
pH: 5 (ref 5.0–8.0)

## 2018-02-13 LAB — CBC WITH DIFFERENTIAL/PLATELET
Abs Immature Granulocytes: 0.14 10*3/uL — ABNORMAL HIGH (ref 0.00–0.07)
BASOS ABS: 0.1 10*3/uL (ref 0.0–0.1)
BASOS PCT: 0 %
EOS PCT: 0 %
Eosinophils Absolute: 0 10*3/uL (ref 0.0–0.5)
HCT: 47.4 % — ABNORMAL HIGH (ref 36.0–46.0)
Hemoglobin: 14.5 g/dL (ref 12.0–15.0)
Immature Granulocytes: 1 %
Lymphocytes Relative: 20 %
Lymphs Abs: 2.8 10*3/uL (ref 0.7–4.0)
MCH: 27.4 pg (ref 26.0–34.0)
MCHC: 30.6 g/dL (ref 30.0–36.0)
MCV: 89.6 fL (ref 80.0–100.0)
MONO ABS: 0.8 10*3/uL (ref 0.1–1.0)
Monocytes Relative: 6 %
NRBC: 0 % (ref 0.0–0.2)
Neutro Abs: 10 10*3/uL — ABNORMAL HIGH (ref 1.7–7.7)
Neutrophils Relative %: 73 %
PLATELETS: 317 10*3/uL (ref 150–400)
RBC: 5.29 MIL/uL — AB (ref 3.87–5.11)
RDW: 14.6 % (ref 11.5–15.5)
WBC: 13.8 10*3/uL — AB (ref 4.0–10.5)

## 2018-02-13 LAB — COMPREHENSIVE METABOLIC PANEL
ALT: 25 U/L (ref 0–44)
AST: 20 U/L (ref 15–41)
Albumin: 3.7 g/dL (ref 3.5–5.0)
Alkaline Phosphatase: 114 U/L (ref 38–126)
Anion gap: 10 (ref 5–15)
BILIRUBIN TOTAL: 0.3 mg/dL (ref 0.3–1.2)
BUN: 31 mg/dL — ABNORMAL HIGH (ref 8–23)
CO2: 29 mmol/L (ref 22–32)
CREATININE: 0.72 mg/dL (ref 0.44–1.00)
Calcium: 9.8 mg/dL (ref 8.9–10.3)
Chloride: 108 mmol/L (ref 98–111)
Glucose, Bld: 305 mg/dL — ABNORMAL HIGH (ref 70–99)
POTASSIUM: 4.2 mmol/L (ref 3.5–5.1)
Sodium: 147 mmol/L — ABNORMAL HIGH (ref 135–145)
TOTAL PROTEIN: 7.5 g/dL (ref 6.5–8.1)

## 2018-02-13 LAB — TROPONIN I: TROPONIN I: 0.03 ng/mL — AB (ref <0.03)

## 2018-02-13 LAB — LACTIC ACID, PLASMA
Lactic Acid, Venous: 2.6 mmol/L (ref 0.5–1.9)
Lactic Acid, Venous: 1.9 mmol/L (ref 0.5–1.9)

## 2018-02-13 LAB — INFLUENZA PANEL BY PCR (TYPE A & B)
Influenza A By PCR: NEGATIVE
Influenza B By PCR: NEGATIVE

## 2018-02-13 LAB — TSH: TSH: 0.453 u[IU]/mL (ref 0.350–4.500)

## 2018-02-13 MED ORDER — VANCOMYCIN HCL IN DEXTROSE 1-5 GM/200ML-% IV SOLN
1000.0000 mg | INTRAVENOUS | Status: DC
  Administered 2018-02-14: 1000 mg via INTRAVENOUS
  Filled 2018-02-13 (×2): qty 200

## 2018-02-13 MED ORDER — SODIUM CHLORIDE 0.9 % IV BOLUS (SEPSIS)
250.0000 mL | Freq: Once | INTRAVENOUS | Status: AC
  Administered 2018-02-13: 250 mL via INTRAVENOUS

## 2018-02-13 MED ORDER — SODIUM CHLORIDE 0.9 % IV BOLUS (SEPSIS)
500.0000 mL | Freq: Once | INTRAVENOUS | Status: AC
  Administered 2018-02-13: 500 mL via INTRAVENOUS

## 2018-02-13 MED ORDER — SODIUM CHLORIDE 0.9 % IV SOLN
2.0000 g | Freq: Once | INTRAVENOUS | Status: AC
  Administered 2018-02-13: 2 g via INTRAVENOUS
  Filled 2018-02-13: qty 2

## 2018-02-13 MED ORDER — SODIUM CHLORIDE 0.9 % IV SOLN
1.0000 g | Freq: Two times a day (BID) | INTRAVENOUS | Status: DC
  Administered 2018-02-14 (×2): 1 g via INTRAVENOUS
  Filled 2018-02-13 (×3): qty 1

## 2018-02-13 MED ORDER — VANCOMYCIN HCL IN DEXTROSE 1-5 GM/200ML-% IV SOLN
1000.0000 mg | Freq: Once | INTRAVENOUS | Status: AC
  Administered 2018-02-13: 1000 mg via INTRAVENOUS
  Filled 2018-02-13: qty 200

## 2018-02-13 MED ORDER — SODIUM CHLORIDE 0.9 % IV BOLUS (SEPSIS)
1000.0000 mL | Freq: Once | INTRAVENOUS | Status: AC
  Administered 2018-02-13: 1000 mL via INTRAVENOUS

## 2018-02-13 MED ORDER — METRONIDAZOLE IN NACL 5-0.79 MG/ML-% IV SOLN
500.0000 mg | Freq: Three times a day (TID) | INTRAVENOUS | Status: DC
  Administered 2018-02-13: 500 mg via INTRAVENOUS
  Filled 2018-02-13 (×3): qty 100

## 2018-02-13 NOTE — ED Notes (Signed)
Family at bedside. 

## 2018-02-13 NOTE — Progress Notes (Signed)
CODE SEPSIS - PHARMACY COMMUNICATION  **Broad Spectrum Antibiotics should be administered within 1 hour of Sepsis diagnosis**  Time Code Sepsis Called/Page Received: 1752  Antibiotics Ordered: aztreonam/vanc/flagyl  Time of 1st antibiotic administration: 1846  Additional action taken by pharmacy:   If necessary, Name of Provider/Nurse Contacted:     Olene Floss ,PharmD Clinical Pharmacist  02/13/2018  6:02 PM

## 2018-02-13 NOTE — ED Notes (Signed)
Date and time results received: 02/13/18 6:38 PM   Test: Lactic Acid Critical Value: 2.6 mmol/L  Name of Provider Notified: Dr. Pershing Proud

## 2018-02-13 NOTE — ED Provider Notes (Signed)
[facility] Emergency Department Provider Note  ____________________________________________   First MD Initiated Contact with Patient 02/13/18 1738     (approximate)  I have reviewed the triage vital signs and the nursing notes.   HISTORY  Chief Complaint Fever; Tachycardia; and Hypertension   HPI Kimberly Rodgers is a 83 y.o. female history of CAD and COPD on azithromycin as well as prednisone who was presented to the emergency department today with a rapid heart rate as well as hypertension.  EMS reports that they were told the patient is at her baseline mental status.  Also reporting the patient is on baseline nasal cannula oxygen with baseline saturations 89% and above.  Patient nonverbal at this time.   Past Medical History:  Diagnosis Date  . Coronary artery disease   . Diabetes mellitus without complication (HCC)   . Hyperlipidemia   . Hypertension   . Hypoxemia   . Memory loss   . Personal history of fall     Patient Active Problem List   Diagnosis Date Noted  . Sepsis (HCC) 02/17/2016  . Syncope 06/11/2013  . CAD (coronary artery disease) of artery bypass graft 06/11/2013  . Bradycardia 06/11/2013  . Falls 06/11/2013  . Diabetes type 2, controlled (HCC) 06/11/2013  . Osteoarthritis (arthritis due to wear and tear of joints) 06/11/2013  . Smoker 06/11/2013  . COPD (chronic obstructive pulmonary disease) with emphysema (HCC) 06/11/2013    Past Surgical History:  Procedure Laterality Date  . CARDIAC CATHETERIZATION  02/16/2009  . CATARACT EXTRACTION, BILATERAL    . CORONARY ARTERY BYPASS GRAFT  1998   CABG x 3 @ [facility]  . GALLBLADDER SURGERY    . IMPLANTABLE CONTACT LENS IMPLANTATION    . TONSILLECTOMY    . TOTAL VAGINAL HYSTERECTOMY      Prior to Admission medications   Medication Sig Start Date End Date Taking? Authorizing Provider  albuterol (PROVENTIL HFA;VENTOLIN HFA) 108 (90 Base) MCG/ACT inhaler Inhale 2 puffs  into the lungs every 6 (six) hours as needed for wheezing or shortness of breath. 02/20/16   Altamese Dilling, MD  aspirin EC 81 MG tablet Take 81 mg by mouth daily.    [provider]  levofloxacin (LEVAQUIN) 750 MG tablet Take 1 tablet (750 mg total) by mouth every other day. 02/21/16   Altamese Dilling, MD  metFORMIN (GLUCOPHAGE) 500 MG tablet Take 500 mg by mouth daily with breakfast.    [provider]  traZODone (DESYREL) 100 MG tablet Take 100 mg by mouth at bedtime.  06/08/13   [provider]  venlafaxine XR (EFFEXOR-XR) 37.5 MG 24 hr capsule Take 37.5 mg by mouth daily with breakfast.  05/12/13   [provider]    Allergies Codeine; Penicillins; Pregabalin; and Wellbutrin [bupropion]  Family History  Problem Relation Age of Onset  . Hyperlipidemia Sister     Social History Social History   Tobacco Use  . Smoking status: Former Smoker    Packs/day: 0.25    Years: 61.00    Pack years: 15.25    Types: Cigarettes  . Smokeless tobacco: Never Used  Substance Use Topics  . Alcohol use: No  . Drug use: No    Review of Systems Level 5 caveat secondary to patient nonverbal.  ____________________________________________   PHYSICAL EXAM:  VITAL SIGNS: ED Triage Vitals  Enc Vitals Group     BP 02/13/18 1757 (!) 189/98     Pulse Rate 02/13/18 1757 (!) 131  Resp 02/13/18 1757 18     Temp 02/13/18 1816 (!) 101.4 F (38.6 C)     Temp Source 02/13/18 1816 Rectal     SpO2 02/13/18 1740 92 %     Weight 02/13/18 1758 120 lb (54.4 kg)     Height 02/13/18 1758 5\' 1"  (1.549 m)     Head Circumference --      Peak Flow --      Pain Score --      Pain Loc --      Pain Edu? --      Excl. in GC? --     Constitutional: Alert and with eyes open. Eyes: Conjunctivae are normal.  Head: Atraumatic. Nose: No congestion/rhinnorhea. Mouth/Throat: Mucous membranes are moist.  Neck: No stridor.   Cardiovascular: Tachycardic, regular  rhythm. Grossly normal heart sounds.  Respiratory: Normal respiratory effort.  No retractions. Lungs CTAB. Gastrointestinal: Soft and nontender. No distention.  Musculoskeletal: No lower extremity tenderness nor edema.  No joint effusions. Neurologic:   No gross focal neurologic deficits are appreciated. Skin:  Skin is warm, dry and intact. No rash noted.   ____________________________________________   LABS (all labs ordered are listed, but only abnormal results are displayed)  Labs Reviewed  COMPREHENSIVE METABOLIC PANEL - Abnormal; Notable for the following components:      Result Value   Sodium 147 (*)    Glucose, Bld 305 (*)    BUN 31 (*)    All other components within normal limits  CBC WITH DIFFERENTIAL/PLATELET - Abnormal; Notable for the following components:   WBC 13.8 (*)    RBC 5.29 (*)    HCT 47.4 (*)    Neutro Abs 10.0 (*)    Abs Immature Granulocytes 0.14 (*)    All other components within normal limits  URINALYSIS, ROUTINE W REFLEX MICROSCOPIC - Abnormal; Notable for the following components:   Color, Urine YELLOW (*)    APPearance CLEAR (*)    Glucose, UA >=500 (*)    Protein, ur 100 (*)    All other components within normal limits  LACTIC ACID, PLASMA - Abnormal; Notable for the following components:   Lactic Acid, Venous 2.6 (*)    All other components within normal limits  TROPONIN I - Abnormal; Notable for the following components:   Troponin I 0.03 (*)    All other components within normal limits  CULTURE, BLOOD (ROUTINE X 2)  CULTURE, BLOOD (ROUTINE X 2)  URINE CULTURE  LACTIC ACID, PLASMA  INFLUENZA PANEL BY PCR (TYPE A & B)  TSH   ____________________________________________  EKG  ED ECG REPORT I, Arelia Longest, the attending physician, personally viewed and interpreted this ECG.   Date: 02/13/2018  EKG Time: 1742  Rate: 122  Rhythm: sinus tachycardia  Axis: Normal  Intervals:none  ST&T Change: No ST segment elevation or  depression.  No abnormal T wave inversion.  ____________________________________________  RADIOLOGY  No acute findings on the head CT nor the chest x-ray and CT chest without pneumonia. ____________________________________________   PROCEDURES  Procedure(s) performed:   .Critical Care Performed by: Myrna Blazer, MD Authorized by: Myrna Blazer, MD   Critical care provider statement:    Critical care time (minutes):  35   Critical care time was exclusive of:  Separately billable procedures and treating other patients   Critical care was necessary to treat or prevent imminent or life-threatening deterioration of the following conditions:  Sepsis   Critical care was time spent  personally by me on the following activities:  Development of treatment plan with patient or surrogate, discussions with consultants, evaluation of patient's response to treatment, examination of patient, obtaining history from patient or surrogate, ordering and performing treatments and interventions, ordering and review of laboratory studies, ordering and review of radiographic studies, pulse oximetry, re-evaluation of patient's condition and review of old charts   Critical Care performed:   ____________________________________________   INITIAL IMPRESSION / ASSESSMENT AND PLAN / ED COURSE  Pertinent labs & imaging results that were available during my care of the patient were reviewed by me and considered in my medical decision making (see chart for details).  Differential diagnosis includes, but is not limited to, alcohol, illicit or prescription medications, or other toxic ingestion; intracranial pathology such as stroke or intracerebral hemorrhage; fever or infectious causes including sepsis; hypoxemia and/or hypercarbia; uremia; trauma; endocrine related disorders such as diabetes, hypoglycemia, and thyroid-related diseases; hypertensive encephalopathy; etc. As part of my medical  decision making, I reviewed the following data within the electronic MEDICAL RECORD NUMBER Notes from prior ED visits  ----------------------------------------- 7:49 PM on 02/13/2018 -----------------------------------------  Sepsis protocol ordered.  Family now bedside and says that the patient seems to have a worsening mental status but says that she has had a progressively worsening mental status and often has confusion.  We discussed antibiotic treatment as well as flu swab which is ongoing at this time.  No flu results back as of yet.  We also discussed other diagnostic modalities such as lumbar puncture but the family does not want this for the patient at this time.  I spoke with the patient's daughter who says that her top priority is keeping the patient comfortable and she feels that the lumbar puncture would cause significant distress.  Patient to be admitted to the hospital.  Signed out to Dr. Katheren Shams. ____________________________________________   FINAL CLINICAL IMPRESSION(S) / ED DIAGNOSES  Fever.  Sepsis.  Altered mental status.  NEW MEDICATIONS STARTED DURING THIS VISIT:  New Prescriptions   No medications on file     Note:  This document was prepared using Dragon voice recognition software and may include unintentional dictation errors.     Myrna Blazer, MD 02/13/18 1950

## 2018-02-13 NOTE — ED Notes (Signed)
Date and time results received: 02/13/18 7:12 PM  (use smartphrase ".now" to insert current time)  Test: Trop I Critical Value:0.03  Name of Provider Notified: EDP  Orders Received? Or Actions Taken? No new orders at this time

## 2018-02-13 NOTE — Progress Notes (Signed)
Pharmacy Antibiotic Note  Kimberly Rodgers is a 83 y.o. female admitted on 02/13/2018 with sepsis.  Pharmacy has been consulted for Vancomycin, Meropenem dosing.  Plan:  CrCl = 42.3 ml/min Ke = 0.04 hr-1 T1/2 = 17.3 hrs Vd = 38   Vancomycin 1 gm IV X 1 given in ED on 10/17 @ 20:00. Vancomycin 750 mg IV Q18H ordered to start on 10/18 @ 0700, ~ 11 hrs after 1st dose (stacked dosing). This pt will reach Css by 10/21 @ 0800. Will draw 1st trough on 10/21 @ 0730.   Meropenem 1 gm IV Q12H ordered to start on 10/18 @ 000:000.   Height: 5\' 1"  (154.9 cm) Weight: 120 lb (54.4 kg) IBW/kg (Calculated) : 47.8  Temp (24hrs), Avg:100.2 F (37.9 C), Min:99 F (37.2 C), Max:101.4 F (38.6 C)  Recent Labs  Lab 02/13/18 1748 02/13/18 2118  WBC 13.8*  --   CREATININE 0.72  --   LATICACIDVEN 2.6* 1.9    Estimated Creatinine Clearance: 42.3 mL/min (by C-G formula based on SCr of 0.72 mg/dL).    Allergies  Allergen Reactions  . Codeine Other (See Comments)    Reaction: unknown  . Penicillins Other (See Comments)    Reaction: unknown  . Pregabalin Other (See Comments)    Reaction: unknown  . Wellbutrin [Bupropion] Other (See Comments)    Reaction: unknown    Antimicrobials this admission:   >>    >>   Dose adjustments this admission:   Microbiology results:  BCx:   UCx:    Sputum:    MRSA PCR:   Thank you for allowing pharmacy to be a part of this patient's care.  Verita Kuroda D 02/13/2018 11:38 PM

## 2018-02-13 NOTE — H&P (Addendum)
[facility] [place] NAME: Kimberly Rodgers    MR#:  [id number]  DATE OF BIRTH:  December 18, 1934  DATE OF ADMISSION:  02/13/2018  PRIMARY CARE PHYSICIAN: Andrey Spearman, MD   REQUESTING/REFERRING PHYSICIAN: Doran Stabler., MD  CHIEF COMPLAINT:   Chief Complaint  Patient presents with  . Fever  . Tachycardia  . Hypertension    HISTORY OF PRESENT ILLNESS:  Kimberly Rodgers  is a 83 y.o. female who presents with chief complaint as above.  Patient nonverbal at this time, all information obtained from one of patient's daughter who is bedside.  Patient lives with other daughter normally, but was placed in [facility] this past Sunday as daughter is out of town.  Daughter reports that last Monday the patient started to become more lethargic, with a congested cough, clear nasal drainage, requiring supplemental oxygen- which is not baseline, and that she noticed her heart rate and blood pressure were elevated.  The SNF staff told the daughter that pneumonia had been ruled out with an x-ray, and began treating her for a COPD exacerbation with Azithromycin and Prednisone since Tuesday.  Daughter reports that the patient continued to slowly decline and was brought in tonight due to tachycardia, hypertension and fever.  Patient met sepsis criteria and the Sepsis work-up was completed in ED, Hospitalist was called for admission.  Chest X-ray and CT show a LLL Pneumonia, daughter also reports that the patient has been ordered thickened liquids by Speech pathology but will only drink thin liquids coughing after drinking. PAST MEDICAL HISTORY:   Past Medical History:  Diagnosis Date  . COPD (chronic obstructive pulmonary disease) ([place])   . Coronary artery disease   . Diabetes mellitus without complication ([place])   . Hyperlipidemia   . Hypertension   . Hypoxemia   . Memory loss   . Personal history of fall     PAST SURGICAL HISTORY:   Past  Surgical History:  Procedure Laterality Date  . CARDIAC CATHETERIZATION  02/16/2009  . CATARACT EXTRACTION, BILATERAL    . CORONARY ARTERY BYPASS GRAFT  1998   CABG x 3 @ [place]    . IMPLANTABLE CONTACT LENS IMPLANTATION    . TONSILLECTOMY    . TOTAL VAGINAL HYSTERECTOMY      SOCIAL HISTORY:   Social History   Tobacco Use  . Smoking status: Former Smoker    Packs/day: 2.00    Years: 61.00    Pack years: 122.00    Types: Cigarettes    Last attempt to quit: 02/13/2013    Years since quitting: 5.0  . Smokeless tobacco: Never Used  Substance Use Topics  . Alcohol use: No    FAMILY HISTORY:   Family History  Problem Relation Age of Onset  . Hyperlipidemia Sister     DRUG ALLERGIES:   Allergies  Allergen Reactions  . Codeine Other (See Comments)    Reaction: unknown  . Penicillins Other (See Comments)    Reaction: unknown  . Pregabalin Other (See Comments)    Reaction: unknown  . Wellbutrin [Bupropion] Other (See Comments)    Reaction: unknown    MEDICATIONS AT HOME:   Prior to Admission medications   Medication Sig Start Date End Date Taking? Authorizing Provider  amLODipine (NORVASC) 5 MG tablet Take 5 mg by mouth daily.   Yes [provider]  bisacodyl (DULCOLAX) 10 MG suppository Place 10 mg rectally every 3 (  three) days.   Yes [provider]  docusate sodium (COLACE) 100 MG capsule Take 100 mg by mouth daily.   Yes [provider]  metFORMIN (GLUCOPHAGE) 500 MG tablet Take 500 mg by mouth daily with breakfast.   Yes [provider]  traZODone (DESYREL) 100 MG tablet Take 100 mg by mouth at bedtime.  06/08/13  Yes [provider]  venlafaxine XR (EFFEXOR-XR) 37.5 MG 24 hr capsule Take 37.5 mg by mouth daily with breakfast.  05/12/13  Yes [provider]  albuterol (PROVENTIL HFA;VENTOLIN HFA) 108 (90 Base) MCG/ACT inhaler Inhale 2 puffs into the lungs every 6 (six) hours as  needed for wheezing or shortness of breath. Patient not taking: Reported on 02/13/2018 02/20/16   Vaughan Basta, MD    REVIEW OF SYSTEMS:  Review of Systems  Unable to perform ROS: Patient nonverbal     VITAL SIGNS:   Vitals:   02/13/18 2145 02/13/18 2200 02/13/18 2215 02/13/18 2235  BP: (!) 168/86 (!) 176/78 (!) 174/80 (!) 165/78  Pulse: (!) 103 (!) 101 100 100  Resp: 13 16 11 17   Temp:    99 F (37.2 C)  TempSrc:    Rectal  SpO2: 96% 96% 96% 97%  Weight:      Height:       Wt Readings from Last 3 Encounters:  02/13/18 54.4 kg  02/20/16 56.7 kg  06/11/13 59 kg    PHYSICAL EXAMINATION:  Physical Exam  Vitals reviewed. Constitutional: She appears well-developed and well-nourished. No distress.  HENT:  Head: Normocephalic and atraumatic.  Mouth/Throat: Oropharynx is clear and moist.  Eyes: Pupils are equal, round, and reactive to light. Conjunctivae and EOM are normal. No scleral icterus.  Neck: Normal range of motion. Neck supple. No JVD present. No thyromegaly present.  Cardiovascular: Normal rate, regular rhythm and intact distal pulses. Exam reveals no gallop and no friction rub.  No murmur heard. Respiratory: Effort normal. No respiratory distress. She has no wheezes. She has rales.  Bilateral lower lobes  GI: Soft. Bowel sounds are normal. She exhibits no distension. There is no tenderness.  Musculoskeletal: Normal range of motion. She exhibits no edema.  No arthritis, no gout  Lymphadenopathy:    She has no cervical adenopathy.  Neurological: She is alert.  Patient non-verbal but alert- per daughter report this close to baseline. Pt. Unable to participate.  Skin: Skin is warm and dry. No rash noted. No erythema.  Psychiatric:  UTA- patient nonverbal    LABORATORY PANEL:   CBC Recent Labs  Lab 02/13/18 1748  WBC 13.8*  HGB 14.5  HCT 47.4*  PLT 317    ------------------------------------------------------------------------------------------------------------------  Chemistries  Recent Labs  Lab 02/13/18 1748  NA 147*  K 4.2  CL 108  CO2 29  GLUCOSE 305*  BUN 31*  CREATININE 0.72  CALCIUM 9.8  AST 20  ALT 25  ALKPHOS 114  BILITOT 0.3   ------------------------------------------------------------------------------------------------------------------  Cardiac Enzymes Recent Labs  Lab 02/13/18 1748  TROPONINI 0.03*   ------------------------------------------------------------------------------------------------------------------  RADIOLOGY:  Ct Head Wo Contrast  Result Date: 02/13/2018 CLINICAL DATA:  Tachycardia, hypertension and fever. EXAM: CT HEAD WITHOUT CONTRAST TECHNIQUE: Contiguous axial images were obtained from the base of the skull through the vertex without intravenous contrast. COMPARISON:  09/18/2013 FINDINGS: Brain: No evidence of acute infarction, hemorrhage, hydrocephalus, extra-axial collection or mass lesion/mass effect. Moderate brain parenchymal volume loss and deep white matter microangiopathy. Vascular: Calcific atherosclerotic disease of the intra cavernous carotid arteries.  Skull: Normal. Negative for fracture or focal lesion. Sinuses/Orbits: No acute finding. Other: None. IMPRESSION: No acute intracranial abnormality. Moderate brain parenchymal volume loss chronic ischemic microvascular disease. Electronically Signed   By: Fidela Salisbury M.D.   On: 02/13/2018 18:16   Ct Chest Wo Contrast  Result Date: 02/13/2018 CLINICAL DATA:  Hypertension.  Tachycardia.  Fever. EXAM: CT CHEST WITHOUT CONTRAST TECHNIQUE: Multidetector CT imaging of the chest was performed following the standard protocol without IV contrast. COMPARISON:  Chest radiography same day.  Chest CT 10/30/2012. FINDINGS: Cardiovascular: Borderline cardiomegaly. Previous median sternotomy and CABG. Extensive atherosclerotic  calcification of the aorta and the brachiocephalic vessels. Mediastinum/Nodes: Chronically prominent paratracheal and precarinal lymph nodes, similar to the study of 2014 and therefore benign. Lungs/Pleura: Background pattern of emphysema and pulmonary scarring. No pleural effusion. No sign of pneumonia or collapse. New somewhat spiculated 1 cm density in the medial posterior right upper lobe which is of some concern, malignancy not excluded. Extensive calcification of the tracheobronchial cartilage. Upper Abdomen: Negative Musculoskeletal: Previous breast implants with calcifications. No significant spinal finding. IMPRESSION: No evidence of pneumonia. Background pulmonary scarring and mild emphysema. Chronic slight prominence of the mediastinal lymph nodes, unchanged since 2014. Newly seen 1 cm spiculated density in the posteromedial right upper lobe. Malignancy not excluded. Further evaluation recommended. Aortic atherosclerosis.  Previous CABG. Electronically Signed   By: Nelson Chimes M.D.   On: 02/13/2018 19:43   Dg Chest Port 1 View  Result Date: 02/13/2018 CLINICAL DATA:  Tachycardia and altered mental status. EXAM: PORTABLE CHEST 1 VIEW COMPARISON:  02/17/2016 FINDINGS: Stable cardiomegaly with moderate aortic atherosclerosis and post CABG change. Chronic interstitial prominence of the lungs without alveolar consolidation, CHF, effusion or pneumothorax. Remote fracture deformity of the distal right clavicle. Soft tissue calcifications adjacent to the left humeral head are redemonstrated and may reflect calcific rotator cuff tendinopathy or bursitis. Remote bilateral rib fractures. Calcified bilateral breast implants are again noted. IMPRESSION: Stable cardiomegaly with moderate aortic atherosclerosis. Chronic interstitial prominence of the lungs without acute pulmonary disease. Other ancillary findings as above. Electronically Signed   By: Ashley Royalty M.D.   On: 02/13/2018 18:16    EKG:   Orders  placed or performed during the hospital encounter of 02/13/18  . EKG 12-Lead  . EKG 12-Lead  . ED EKG 12-Lead  . ED EKG 12-Lead  . EKG 12-Lead  . EKG 12-Lead    IMPRESSION AND PLAN:  Principal Problem:   Sepsis [facility]): Pharmacy consulted for Vancomycin & Meropenem antibiotic coverage, continuous IVF ordered, continuous cardiac monitoring.  Lactic acid initially elevated, after IVF bolus corrected.  BC, UC sent.  Active Problems:   HCAP: Patient receiving antibiotic coverage listed above    Hypertension: BP elevated here, home medications continued, PRN coverage with labetalol to keep BP < 160/105   Diabetes type 2, controlled ([place])- monitoring blood sugar AC, HS with sensitive sliding scale coverage.    COPD (chronic obstructive pulmonary disease) with emphysema ([place])- home albuterol inhaler continued PRN   Chart review performed and case discussed with ED provider. Labs, imaging and/or ECG reviewed by provider and discussed with patient/family. Management plans discussed with the patient and/or family.  DVT PROPHYLAXIS: SubQ lovenox  w/ SCD's GI PROPHYLAXIS: None  ADMISSION STATUS: Inpatient     CODE STATUS: FULL Code Status History    Date Active Date Inactive Code Status Order ID Comments User Context   02/17/2016 0539 02/20/2016 1911 Full Code [id number]  Harrie Foreman, MD  Inpatient      TOTAL TIME TAKING CARE OF THIS PATIENT: 45 minutes.   Corinne Goucher Somers 02/13/2018, 11:01 PM  [employer]  [phone]  CC: Primary care physician; Andrey Spearman, MD  Note:  This document was prepared using Dragon voice recognition software and may include unintentional dictation errors.

## 2018-02-13 NOTE — ED Triage Notes (Signed)
Pt presents to ED from [facility]. Report by EMS: pt hypertensive, tachicardic and febrile at the NH, EMS states the NH report her baseline mentation and orientation is as she presents upon arrival to ED. Pt's GCS upon assessment is 6. Pt is unable to participate in her own care or provide adequate assessment information.

## 2018-02-14 DIAGNOSIS — L8990 Pressure ulcer of unspecified site, unspecified stage: Secondary | ICD-10-CM

## 2018-02-14 LAB — BLOOD CULTURE ID PANEL (REFLEXED)
Acinetobacter baumannii: NOT DETECTED
CANDIDA ALBICANS: NOT DETECTED
Candida glabrata: NOT DETECTED
Candida krusei: NOT DETECTED
Candida parapsilosis: NOT DETECTED
Candida tropicalis: NOT DETECTED
Enterobacter cloacae complex: NOT DETECTED
Enterobacteriaceae species: NOT DETECTED
Enterococcus species: NOT DETECTED
Escherichia coli: NOT DETECTED
HAEMOPHILUS INFLUENZAE: NOT DETECTED
Klebsiella oxytoca: NOT DETECTED
Klebsiella pneumoniae: NOT DETECTED
LISTERIA MONOCYTOGENES: NOT DETECTED
METHICILLIN RESISTANCE: NOT DETECTED
NEISSERIA MENINGITIDIS: NOT DETECTED
PSEUDOMONAS AERUGINOSA: NOT DETECTED
Proteus species: NOT DETECTED
STREPTOCOCCUS PNEUMONIAE: NOT DETECTED
STREPTOCOCCUS PYOGENES: NOT DETECTED
STREPTOCOCCUS SPECIES: NOT DETECTED
Serratia marcescens: NOT DETECTED
Staphylococcus aureus (BCID): NOT DETECTED
Staphylococcus species: DETECTED — AB
Streptococcus agalactiae: NOT DETECTED

## 2018-02-14 LAB — BASIC METABOLIC PANEL
Anion gap: 8 (ref 5–15)
BUN: 23 mg/dL (ref 8–23)
CHLORIDE: 113 mmol/L — AB (ref 98–111)
CO2: 27 mmol/L (ref 22–32)
CREATININE: 0.48 mg/dL (ref 0.44–1.00)
Calcium: 8.6 mg/dL — ABNORMAL LOW (ref 8.9–10.3)
GFR calc Af Amer: >60 mL/min (ref >60)
GLUCOSE: 154 mg/dL — AB (ref 70–99)
POTASSIUM: 3.5 mmol/L (ref 3.5–5.1)
Sodium: 148 mmol/L — ABNORMAL HIGH (ref 135–145)

## 2018-02-14 LAB — CBC
HEMATOCRIT: 42.6 % (ref 36.0–46.0)
Hemoglobin: 12.8 g/dL (ref 12.0–15.0)
MCH: 27.5 pg (ref 26.0–34.0)
MCHC: 30 g/dL (ref 30.0–36.0)
MCV: 91.4 fL (ref 80.0–100.0)
Platelets: 271 10*3/uL (ref 150–400)
RBC: 4.66 MIL/uL (ref 3.87–5.11)
RDW: 14.7 % (ref 11.5–15.5)
WBC: 13.1 10*3/uL — AB (ref 4.0–10.5)
nRBC: 0 % (ref 0.0–0.2)

## 2018-02-14 LAB — GLUCOSE, CAPILLARY
Glucose-Capillary: 146 mg/dL — ABNORMAL HIGH (ref 70–99)
Glucose-Capillary: 148 mg/dL — ABNORMAL HIGH (ref 70–99)
Glucose-Capillary: 157 mg/dL — ABNORMAL HIGH (ref 70–99)
Glucose-Capillary: 90 mg/dL (ref 70–99)

## 2018-02-14 LAB — MRSA PCR SCREENING: MRSA by PCR: NEGATIVE

## 2018-02-14 MED ORDER — ACETAMINOPHEN 325 MG PO TABS
650.0000 mg | ORAL_TABLET | Freq: Four times a day (QID) | ORAL | Status: DC | PRN
  Administered 2018-02-15: 650 mg via ORAL
  Filled 2018-02-14: qty 2

## 2018-02-14 MED ORDER — SODIUM CHLORIDE 0.9 % IV SOLN
1.0000 g | INTRAVENOUS | Status: DC
  Administered 2018-02-14 – 2018-02-15 (×2): 1 g via INTRAVENOUS
  Filled 2018-02-14: qty 1
  Filled 2018-02-14: qty 10
  Filled 2018-02-14: qty 1

## 2018-02-14 MED ORDER — ALBUTEROL SULFATE (2.5 MG/3ML) 0.083% IN NEBU: 3.0000 mL | INHALATION_SOLUTION | Freq: Four times a day (QID) | RESPIRATORY_TRACT | Status: DC | PRN

## 2018-02-14 MED ORDER — AMLODIPINE BESYLATE 5 MG PO TABS
5.0000 mg | ORAL_TABLET | Freq: Every day | ORAL | Status: DC
  Administered 2018-02-15 – 2018-02-16 (×2): 5 mg via ORAL
  Filled 2018-02-14 (×3): qty 1

## 2018-02-14 MED ORDER — ENOXAPARIN SODIUM 40 MG/0.4ML ~~LOC~~ SOLN
40.0000 mg | SUBCUTANEOUS | Status: DC
  Administered 2018-02-14 – 2018-02-16 (×3): 40 mg via SUBCUTANEOUS
  Filled 2018-02-14 (×3): qty 0.4

## 2018-02-14 MED ORDER — VENLAFAXINE HCL ER 37.5 MG PO CP24
37.5000 mg | ORAL_CAPSULE | Freq: Every day | ORAL | Status: DC
  Administered 2018-02-15 – 2018-02-16 (×2): 37.5 mg via ORAL
  Filled 2018-02-14 (×3): qty 1

## 2018-02-14 MED ORDER — ORAL CARE MOUTH RINSE
15.0000 mL | Freq: Two times a day (BID) | OROMUCOSAL | Status: DC
  Administered 2018-02-15 (×3): 15 mL via OROMUCOSAL

## 2018-02-14 MED ORDER — METRONIDAZOLE IN NACL 5-0.79 MG/ML-% IV SOLN: 500.0000 mg | Freq: Three times a day (TID) | INTRAVENOUS | Status: DC

## 2018-02-14 MED ORDER — ACETAMINOPHEN 650 MG RE SUPP: 650.0000 mg | Freq: Four times a day (QID) | RECTAL | Status: DC | PRN

## 2018-02-14 MED ORDER — SODIUM CHLORIDE 0.9 % IV SOLN
INTRAVENOUS | Status: AC
  Administered 2018-02-14: 01:00:00 via INTRAVENOUS

## 2018-02-14 MED ORDER — DOCUSATE SODIUM 100 MG PO CAPS
100.0000 mg | ORAL_CAPSULE | Freq: Every day | ORAL | Status: DC
  Administered 2018-02-14 – 2018-02-16 (×3): 100 mg via ORAL
  Filled 2018-02-14 (×3): qty 1

## 2018-02-14 MED ORDER — BISACODYL 10 MG RE SUPP
10.0000 mg | RECTAL | Status: DC
  Administered 2018-02-14: 10 mg via RECTAL
  Filled 2018-02-14: qty 1

## 2018-02-14 MED ORDER — INSULIN ASPART 100 UNIT/ML ~~LOC~~ SOLN
0.0000 [IU] | Freq: Three times a day (TID) | SUBCUTANEOUS | Status: DC
  Administered 2018-02-14 – 2018-02-15 (×4): 1 [IU] via SUBCUTANEOUS
  Administered 2018-02-15 (×3): 3 [IU] via SUBCUTANEOUS
  Administered 2018-02-16: 2 [IU] via SUBCUTANEOUS
  Administered 2018-02-16: 3 [IU] via SUBCUTANEOUS
  Filled 2018-02-14 (×9): qty 1

## 2018-02-14 MED ORDER — LABETALOL HCL 5 MG/ML IV SOLN
5.0000 mg | INTRAVENOUS | Status: DC | PRN
  Administered 2018-02-16: 5 mg via INTRAVENOUS
  Filled 2018-02-14 (×2): qty 4

## 2018-02-14 NOTE — Consult Note (Signed)
Pharmacy Antibiotic Note  Kimberly Rodgers is a 83 y.o. female admitted on 02/13/2018 with fever.  Pharmacy has been consulted for Ceftriaxone dosing.  Plan: Ceftriaxone 1g q24  Height: 5\' 1"  (154.9 cm) Weight: 113 lb 5.1 oz (51.4 kg) IBW/kg (Calculated) : 47.8  Temp (24hrs), Avg:98.8 F (37.1 C), Min:97.4 F (36.3 C), Max:101.4 F (38.6 C)  Recent Labs  Lab 02/13/18 1748 02/13/18 2118 02/14/18 0556  WBC 13.8*  --  13.1*  CREATININE 0.72  --  0.48  LATICACIDVEN 2.6* 1.9  --     Estimated Creatinine Clearance: 42.3 mL/min (by C-G formula based on SCr of 0.48 mg/dL).    Allergies  Allergen Reactions  . Codeine Other (See Comments)    Reaction: unknown  . Penicillins Other (See Comments)    Reaction: unknown  . Pregabalin Other (See Comments)    Reaction: unknown  . Wellbutrin [Bupropion] Other (See Comments)    Reaction: unknown    Antimicrobials this admission: Meropenem 10/17 >> 10/18 Vancomycin 10/17 >> 10/18 Ceftriaxone 10/18>>    Microbiology results: 10/17 BCx: see below 10/17 UCx: pending  10/18 MRSA PCR: negative  BCID Staph species detected 1/4 bottles (aerobic only) - Methicillin Susceptible Coag Neg   Discussion with MD.  Dr Esaw Grandchild thought contaminent.  Patient not clinically ill.  Started Ceftriaxone therapy for fever.  Patient is not bacteremic and has no pneumonia.   Thank you for allowing pharmacy to be a part of this patient's care.  Albina Billet, PharmD Clinical Pharmacist 02/14/2018 3:14 PM

## 2018-02-14 NOTE — Care Management Note (Addendum)
Case Management Note  Patient Details  Name: [name] MRN: [id number] Date of Birth: 28-Jan-1935  Subjective/Objective:       Patient admitted to the hospital with sepsis.  Patient is on acute O2.  Patient is a total care patient, she was admitted from [facility] while her daughter was away for respite care.  The daughter states that she wants her mother to come home with her.  Kimberly Rodgers is the daughter and she states that "she does not have a job outside of the home, taking care of her mother is her job."  Hospital bed is in the home, daughter also states that they have 2 Surveyor, quantity.  When they need to transport the patient the daughter's husband physically picks up the patient and places her in the car.  The daughter states they would be interested in wheelchair transport.  They do not have any lift equipment, the patient mostly stays in the bed and the daughter turns her every 2-3 hours.  Daughter Kimberly Rodgers is agreeable to having Home health services she has no preference for agency- choice offered.                   Action/Plan:   Expected Discharge Date:                  Expected Discharge Plan:     In-House Referral:     Discharge planning Services     Post Acute Care Choice:    Choice offered to:     DME Arranged:    DME Agency:     HH Arranged:    HH Agency:     Status of Service:     If discussed at [employer] of Stay Meetings, dates discussed:    Additional Comments:  Allayne Butcher, RN 02/14/2018, 4:44 PM

## 2018-02-14 NOTE — Clinical Social Work Note (Signed)
Clinical Social Work Assessment  Patient Details  Name: Kimberly Rodgers MRN: [id number] Date of Birth: [birth year]  Date of referral:  02/14/18               Reason for consult:  Discharge Planning                Permission sought to share information with:    Permission granted to share information::     Name::        Agency::     Relationship::     Contact Information:     Housing/Transportation Living arrangements for the past 2 months:  Single Family Home Source of Information:  Adult Children Patient Interpreter Needed:  None Criminal Activity/Legal Involvement Pertinent to Current Situation/Hospitalization:  No - Comment as needed Significant Relationships:  Adult Children Lives with:  Adult Children Do you feel safe going back to the place where you live?  Yes Need for family participation in patient care:  Yes (Comment)  Care giving concerns:  Patient at baseline resides at home with her daughter and son in law however she was in [facility] this past week for respite care.    Social Worker assessment / plan:  CSW met with patient's daughter and son in law and patient's daughter is adamant that she will be taking her mother home at discharge. She is also adamant that she does not want her to go via EMS because she does not want to have to pay for it. RN CM has been made aware.   Employment status:    Insurance information:    PT Recommendations:  [place] / Referral to community resources:     Patient/Family's Response to care:  Patient's daughter expressed appreciation for CSW's visit.   Patient/Family's Understanding of and Emotional Response to Diagnosis, Current Treatment, and Prognosis:  Patient's daughter seemed very concern regarding her mother.  Emotional Assessment Appearance:  Appears stated age Attitude/Demeanor/Rapport:  (non verbal) Affect (typically observed):  (sleeping) Orientation:    Alcohol / Substance use:  Not  Applicable Psych involvement (Current and /or in the community):  No (Comment)  Discharge Needs  Concerns to be addressed:  Care Coordination Readmission within the last 30 days:  No Current discharge risk:  None Barriers to Discharge:  No Barriers Identified   Shela Leff, LCSW 02/14/2018, 3:08 PM

## 2018-02-14 NOTE — Plan of Care (Signed)
Patient has dementia and dose not speak and cognitively unable to be educated.

## 2018-02-14 NOTE — Progress Notes (Signed)
PHARMACY - PHYSICIAN COMMUNICATION CRITICAL VALUE ALERT - BLOOD CULTURE IDENTIFICATION (BCID)  Results for orders placed or performed during the hospital encounter of 02/13/18  Blood Culture ID Panel (Reflexed) (Collected: 02/13/2018  5:55 PM)  Result Value Ref Range   Enterococcus species NOT DETECTED NOT DETECTED   Listeria monocytogenes NOT DETECTED NOT DETECTED   Staphylococcus species DETECTED (A) NOT DETECTED   Staphylococcus aureus (BCID) NOT DETECTED NOT DETECTED   Methicillin resistance NOT DETECTED NOT DETECTED   Streptococcus species NOT DETECTED NOT DETECTED   Streptococcus agalactiae NOT DETECTED NOT DETECTED   Streptococcus pneumoniae NOT DETECTED NOT DETECTED   Streptococcus pyogenes NOT DETECTED NOT DETECTED   Acinetobacter baumannii NOT DETECTED NOT DETECTED   Enterobacteriaceae species NOT DETECTED NOT DETECTED   Enterobacter cloacae complex NOT DETECTED NOT DETECTED   Escherichia coli NOT DETECTED NOT DETECTED   Klebsiella oxytoca NOT DETECTED NOT DETECTED   Klebsiella pneumoniae NOT DETECTED NOT DETECTED   Proteus species NOT DETECTED NOT DETECTED   Serratia marcescens NOT DETECTED NOT DETECTED   Haemophilus influenzae NOT DETECTED NOT DETECTED   Neisseria meningitidis NOT DETECTED NOT DETECTED   Pseudomonas aeruginosa NOT DETECTED NOT DETECTED   Candida albicans NOT DETECTED NOT DETECTED   Candida glabrata NOT DETECTED NOT DETECTED   Candida krusei NOT DETECTED NOT DETECTED   Candida parapsilosis NOT DETECTED NOT DETECTED   Candida tropicalis NOT DETECTED NOT DETECTED    Name of physician (or Provider) Contacted: Dr Elisabeth Pigeon  Changes to prescribed antibiotics required:   D/C'ed Vancomycin and Meropenem  Discussion with MD.  Dr Esaw Grandchild thought contaminent.  Patient not clinically ill.  Started therapy for fever.  Patient is not bacteremic and has no pneumonia.  Ceftriaxone 1g q24  Albina Billet, PharmD Clinical Pharmacist 02/14/2018 3:00 PM

## 2018-02-14 NOTE — Progress Notes (Signed)
[employer] - [place] [facility]   PATIENT NAME: Kimberly Rodgers    MR#:  [id number]  DATE OF BIRTH:  1935/02/21  SUBJECTIVE:  CHIEF COMPLAINT:   Chief Complaint  Patient presents with  . Fever  . Tachycardia  . Hypertension    Was on Respite care while daughter was out for 1 week, had AMS, Hypoxia and respi distress per NH> She is non verbal due to dementia and does nto appear In any discomfort.  REVIEW OF SYSTEMS:  Dementia and non verbal. So can not give ROS>  ROS  DRUG ALLERGIES:   Allergies  Allergen Reactions  . Codeine Other (See Comments)    Reaction: unknown  . Penicillins Other (See Comments)    Reaction: unknown  . Pregabalin Other (See Comments)    Reaction: unknown  . Wellbutrin [Bupropion] Other (See Comments)    Reaction: unknown    VITALS:  Blood pressure 106/62, pulse 93, temperature 97.7 F (36.5 C), temperature source Axillary, resp. rate 16, height 5\' 1"  (1.549 m), weight 51.4 kg, SpO2 95 %.  PHYSICAL EXAMINATION:  GENERAL:  83 y.o.-year-old patient lying in the bed with no acute distress.  EYES: Pupils equal, round, reactive to light and accommodation. No scleral icterus. Extraocular muscles intact.  HEENT: Head atraumatic, normocephalic. Oropharynx and nasopharynx clear.  NECK:  Supple, no jugular venous distention. No thyroid enlargement, no tenderness.  LUNGS: Normal breath sounds bilaterally, no wheezing, rales,rhonchi or crepitation. No use of accessory muscles of respiration.  CARDIOVASCULAR: S1, S2 normal. No murmurs, rubs, or gallops.  ABDOMEN: Soft, nontender, nondistended. Bowel sounds present. No organomegaly or mass.  EXTREMITIES: No pedal edema, cyanosis, or clubbing.  NEUROLOGIC: Cranial nerves II through XII are intact. Muscle strength 1/5 in all extremities, atrophic changes. Sensation and Gait not checked. Pt does nto follow commands due to terminal dementia. PSYCHIATRIC: The patient is alert and oriented x 0.   SKIN: No obvious rash, lesion, or ulcer.   Physical Exam LABORATORY PANEL:   CBC Recent Labs  Lab 02/14/18 0556  WBC 13.1*  HGB 12.8  HCT 42.6  PLT 271   ------------------------------------------------------------------------------------------------------------------  Chemistries  Recent Labs  Lab 02/13/18 1748 02/14/18 0556  NA 147* 148*  K 4.2 3.5  CL 108 113*  CO2 29 27  GLUCOSE 305* 154*  BUN 31* 23  CREATININE 0.72 0.48  CALCIUM 9.8 8.6*  AST 20  --   ALT 25  --   ALKPHOS 114  --   BILITOT 0.3  --    ------------------------------------------------------------------------------------------------------------------  Cardiac Enzymes Recent Labs  Lab 02/13/18 1748  TROPONINI 0.03*   ------------------------------------------------------------------------------------------------------------------  RADIOLOGY:  Ct Head Wo Contrast  Result Date: 02/13/2018 CLINICAL DATA:  Tachycardia, hypertension and fever. EXAM: CT HEAD WITHOUT CONTRAST TECHNIQUE: Contiguous axial images were obtained from the base of the skull through the vertex without intravenous contrast. COMPARISON:  09/18/2013 FINDINGS: Brain: No evidence of acute infarction, hemorrhage, hydrocephalus, extra-axial collection or mass lesion/mass effect. Moderate brain parenchymal volume loss and deep white matter microangiopathy. Vascular: Calcific atherosclerotic disease of the intra cavernous carotid arteries. Skull: Normal. Negative for fracture or focal lesion. Sinuses/Orbits: No acute finding. Other: None. IMPRESSION: No acute intracranial abnormality. Moderate brain parenchymal volume loss chronic ischemic microvascular disease. Electronically Signed   By: Ted Mcalpine M.D.   On: 02/13/2018 18:16   Ct Chest Wo Contrast  Result Date: 02/13/2018 CLINICAL DATA:  Hypertension.  Tachycardia.  Fever. EXAM: CT CHEST WITHOUT CONTRAST TECHNIQUE: Multidetector CT  imaging of the chest was performed  following the standard protocol without IV contrast. COMPARISON:  Chest radiography same day.  Chest CT 10/30/2012. FINDINGS: Cardiovascular: Borderline cardiomegaly. Previous median sternotomy and CABG. Extensive atherosclerotic calcification of the aorta and the brachiocephalic vessels. Mediastinum/Nodes: Chronically prominent paratracheal and precarinal lymph nodes, similar to the study of 2014 and therefore benign. Lungs/Pleura: Background pattern of emphysema and pulmonary scarring. No pleural effusion. No sign of pneumonia or collapse. New somewhat spiculated 1 cm density in the medial posterior right upper lobe which is of some concern, malignancy not excluded. Extensive calcification of the tracheobronchial cartilage. Upper Abdomen: Negative Musculoskeletal: Previous breast implants with calcifications. No significant spinal finding. IMPRESSION: No evidence of pneumonia. Background pulmonary scarring and mild emphysema. Chronic slight prominence of the mediastinal lymph nodes, unchanged since 2014. Newly seen 1 cm spiculated density in the posteromedial right upper lobe. Malignancy not excluded. Further evaluation recommended. Aortic atherosclerosis.  Previous CABG. Electronically Signed   By: Paulina Fusi M.D.   On: 02/13/2018 19:43   Dg Chest Port 1 View  Result Date: 02/13/2018 CLINICAL DATA:  Tachycardia and altered mental status. EXAM: PORTABLE CHEST 1 VIEW COMPARISON:  02/17/2016 FINDINGS: Stable cardiomegaly with moderate aortic atherosclerosis and post CABG change. Chronic interstitial prominence of the lungs without alveolar consolidation, CHF, effusion or pneumothorax. Remote fracture deformity of the distal right clavicle. Soft tissue calcifications adjacent to the left humeral head are redemonstrated and may reflect calcific rotator cuff tendinopathy or bursitis. Remote bilateral rib fractures. Calcified bilateral breast implants are again noted. IMPRESSION: Stable cardiomegaly with moderate  aortic atherosclerosis. Chronic interstitial prominence of the lungs without acute pulmonary disease. Other ancillary findings as above. Electronically Signed   By: Tollie Eth M.D.   On: 02/13/2018 18:16    ASSESSMENT AND PLAN:   Principal Problem:   Sepsis (HCC) Active Problems:   CAD (coronary artery disease) of artery bypass graft   Diabetes type 2, controlled (HCC)   COPD (chronic obstructive pulmonary disease) with emphysema (HCC)   Pneumonia   Pressure injury of skin  Sepsis (HCC):  Started on Vancomycin & Meropenem antibiotic coverage, continuous IVF ordered, continuous cardiac monitoring.  Lactic acid initially elevated, after IVF bolus corrected.  BC, UC sent.  No source of infection yet.  Will change Abx to rocephin and stop soon.    HCAP- no sources yet, may be able to r/o HCAP - IF xray chest negative: Patient receiving antibiotic coverage listed above  * bacteremia- in 1/4 bottles- contamination.    Hypertension: BP elevated here, home medications continued, PRN coverage with labetalol to keep BP < 160/105   Diabetes type 2, controlled (HCC)- monitoring blood sugar AC, HS with sensitive sliding scale coverage.    COPD (chronic obstructive pulmonary disease) with emphysema (HCC)- home albuterol inhaler continued PRN  Terminal dementia   At baseline- lives with her daughter does not walk and is a " total care" patient. Daughter will take her home once discharge from here.  All the records are reviewed and case discussed with Care Management/Social Workerr. Management plans discussed with the patient, family and they are in agreement.  CODE STATUS: Full.  TOTAL TIME TAKING CARE OF THIS PATIENT: 35 minutes.     POSSIBLE D/C IN 1-2 DAYS, DEPENDING ON CLINICAL CONDITION.   Altamese Dilling M.D on 02/14/2018   Between 7am to 6pm - Pager - [phone]  After 6pm go to [email or web address] - password [email or web address]  [employer]  [phone]  CC: Primary care physician; Madelon Lips, MD  Note: This dictation was prepared with Dragon dictation along with smaller phrase technology. Any transcriptional errors that result from this process are unintentional.

## 2018-02-14 NOTE — Progress Notes (Signed)
Family Meeting Note  Advance Directive:no  Today a meeting took place with the daughter.  Patient is unable to participate due [id number] capacity dementia   The following clinical team members were present during this meeting:MD  The following were discussed:Patient's diagnosis: Terminal dementia, infection.  Patient's progosis: Unable to determine and Goals for treatment: Full Code  Additional follow-up to be provided: PMD  Time spent during discussion:20 minutes  Altamese Dilling, MD

## 2018-02-14 NOTE — Evaluation (Signed)
Clinical/Bedside Swallow Evaluation Patient Details  Name: Kimberly Rodgers MRN: [id number] Date of Birth: 1935-03-17  Today's Date: 02/14/2018 Time: SLP Start Time (ACUTE ONLY): 1002 SLP Stop Time (ACUTE ONLY): 1102 SLP Time Calculation (min) (ACUTE ONLY): 60 min  Past Medical History:  Past Medical History:  Diagnosis Date  . COPD (chronic obstructive pulmonary disease) ([place])   . Coronary artery disease   . Diabetes mellitus without complication ([place])   . Hyperlipidemia   . Hypertension   . Hypoxemia   . Memory loss   . Personal history of fall    Past Surgical History:  Past Surgical History:  Procedure Laterality Date  . CARDIAC CATHETERIZATION  02/16/2009  . CATARACT EXTRACTION, BILATERAL    . CORONARY ARTERY BYPASS GRAFT  1998   CABG x 3 @ [place]    . IMPLANTABLE CONTACT LENS IMPLANTATION    . TONSILLECTOMY    . TOTAL VAGINAL HYSTERECTOMY     HPI:  Pt  is a 83 y.o. female who presents with chief complaint fever, tachycardia, HTN.  Patient nonverbal at this time, all information obtained from one of patient's daughter who is bedside.  Patient lives with other daughter normally, but was placed in [facility] this past Sunday as daughter is out of town.  Daughter reports that last Monday the patient started to become more lethargic, with a congested cough, clear nasal drainage, requiring supplemental oxygen- which is not baseline, and that she noticed her heart rate and blood pressure were elevated.  The SNF staff told the daughter that pneumonia had been ruled out with an x-ray, and began treating her for a COPD exacerbation with Azithromycin and Prednisone since Tuesday.  Daughter reports that the patient continued to slowly decline and was brought in tonight due to tachycardia, hypertension and fever.  Patient met sepsis criteria and the Sepsis work-up was completed in ED, Hospitalist was called for admission.  Chest X-ray and CT show a LLL  Pneumonia.  Pt is mostly nonverbal and not following any commands.  MRI revealed Moderate brain parenchymal volume loss chronic ischemic disease.    Assessment / Plan / Recommendation Clinical Impression  Pt appears to present w/ oropharyngeal phase dysphagia and is at increased risk for aspiration at this time. Suspect pt's Cognitive decline is impacting pt's engagement and actively participate w/ po trials; she seemed more passive in her acceptance and bolus management/clearing. Suspect pt has delay in pharyngeal swallow initiation. Pt did exhibit delayed throat clearing post trials of Nectar consistency liquids.  SLP Visit Diagnosis: Dysphagia, oropharyngeal phase (R13.12)    Aspiration Risk  Moderate aspiration risk;Risk for inadequate nutrition/hydration    Diet Recommendation  Dysphagia level 1 (PUREE) w/ NECTAR consistency liquids via TSP; strict aspiration precautions; full feeding assistance  Medication Administration: Crushed with puree    Other  Recommendations Recommended Consults: (Dietician f/u) Oral Care Recommendations: Oral care BID;Staff/trained caregiver to provide oral care Other Recommendations: Order thickener from pharmacy;Prohibited food (jello, ice cream, thin soups);Remove water pitcher;Have oral suction available   Follow up Recommendations Skilled Nursing facility      Frequency and Duration min 3x week  2 weeks       Prognosis Prognosis for Safe Diet Advancement: Fair Barriers to Reach Goals: Cognitive deficits;Severity of deficits      Swallow Study   General Date of Onset: 02/13/18 HPI: Pt  is a 83 y.o. female who presents with chief complaint fever, tachycardia, HTN.  Patient nonverbal  at this time, all information obtained from one of patient's daughter who is bedside.  Patient lives with other daughter normally, but was placed in [facility] this past Sunday as daughter is out of town.  Daughter reports that last Monday the patient started to  become more lethargic, with a congested cough, clear nasal drainage, requiring supplemental oxygen- which is not baseline, and that she noticed her heart rate and blood pressure were elevated.  The SNF staff told the daughter that pneumonia had been ruled out with an x-ray, and began treating her for a COPD exacerbation with Azithromycin and Prednisone since Tuesday.  Daughter reports that the patient continued to slowly decline and was brought in tonight due to tachycardia, hypertension and fever.  Patient met sepsis criteria and the Sepsis work-up was completed in ED, Hospitalist was called for admission.  Chest X-ray and CT show a LLL Pneumonia.  Pt is mostly nonverbal and not following any commands.  MRI revealed Moderate brain parenchymal volume loss chronic ischemic disease.  Type of Study: Bedside Swallow Evaluation Previous Swallow Assessment: unknown but NH had placed pt on thickened liquids in the past few days d/t declining presentation overall  Diet Prior to this Study: Dysphagia 3 (soft);Nectar-thick liquids Temperature Spikes Noted: No(temp WNL from elevated at admission; WBC 13.1 declining) Respiratory Status: Nasal cannula(1-2 liters) History of Recent Intubation: No Behavior/Cognition: Confused;Doesn't follow directions;Requires cueing(Awake) Oral Cavity Assessment: (sticky) Oral Care Completed by SLP: Recent completion by staff Oral Cavity - Dentition: (Dentures) Vision: (n/a) Self-Feeding Abilities: Total assist Patient Positioning: Upright in bed(needed support and positioning) Baseline Vocal Quality: (wnl for "good morning") Volitional Cough: Cognitively unable to elicit Volitional Swallow: Unable to elicit    Oral/Motor/Sensory Function Overall Oral Motor/Sensory Function: (noted slight decreased tone/ROM in R corner of mouth)   Ice Chips Ice chips: Not tested   Thin Liquid Thin Liquid: Not tested    Nectar Thick Nectar Thick Liquid: Impaired Presentation: Spoon(fed; 15  trials) Oral Phase Impairments: Poor awareness of bolus(decreased opening in R corner of mouth; tactile cues) Oral phase functional implications: (no spillage) Pharyngeal Phase Impairments: Cough - Delayed(x1 trial)   Honey Thick Honey Thick Liquid: Not tested   Puree Puree: Impaired Presentation: Spoon(fed; 10 trials) Oral Phase Impairments: Poor awareness of bolus(tactile cues to lips) Oral Phase Functional Implications: Prolonged oral transit(w/ increased lingual movements to fully clear) Pharyngeal Phase Impairments: (none)   Solid     Solid: Not tested      Orinda Kenner, MS, CCC-SLP Watson,Katherine 02/14/2018,11:25 AM

## 2018-02-15 ENCOUNTER — Inpatient Hospital Stay: Payer: Medicare Other

## 2018-02-15 LAB — URINE CULTURE: Culture: NO GROWTH

## 2018-02-15 LAB — CULTURE, BLOOD (ROUTINE X 2): SPECIAL REQUESTS: ADEQUATE

## 2018-02-15 LAB — GLUCOSE, CAPILLARY
GLUCOSE-CAPILLARY: 142 mg/dL — AB (ref 70–99)
GLUCOSE-CAPILLARY: 211 mg/dL — AB (ref 70–99)
Glucose-Capillary: 233 mg/dL — ABNORMAL HIGH (ref 70–99)
Glucose-Capillary: 239 mg/dL — ABNORMAL HIGH (ref 70–99)

## 2018-02-15 MED ORDER — HYDRALAZINE HCL 20 MG/ML IJ SOLN
10.0000 mg | Freq: Once | INTRAMUSCULAR | Status: AC
  Administered 2018-02-15: 10 mg via INTRAVENOUS
  Filled 2018-02-15: qty 1

## 2018-02-15 NOTE — Progress Notes (Signed)
[employer] - [place] at [facility]      PATIENT NAME: Kimberly Rodgers    MR#:  [id number]  DATE OF BIRTH:  Sep 05, 1934  SUBJECTIVE:   Patient presented to the hospital due to suspected sepsis.  Patient is nonverbal due to dementia.  Afebrile overnight, no other acute events overnight.  REVIEW OF SYSTEMS:    Review of Systems  Unable to perform ROS: Dementia    Nutrition: Dysphagia I with nectar thick lquids Tolerating Diet: Yes but very little Tolerating PT: bedbound  DRUG ALLERGIES:   Allergies  Allergen Reactions  . Codeine Other (See Comments)    Reaction: unknown  . Penicillins Other (See Comments)    Reaction: unknown  . Pregabalin Other (See Comments)    Reaction: unknown  . Wellbutrin [Bupropion] Other (See Comments)    Reaction: unknown    VITALS:  Blood pressure (!) 149/69, pulse 91, temperature 98.6 F (37 C), temperature source Oral, resp. rate 17, height 5\' 1"  (1.549 m), weight 51.4 kg, SpO2 95 %.  PHYSICAL EXAMINATION:   Physical Exam  GENERAL:  83 y.o.-year-old demented non-verbal patient lying in bed in no acute distress.  EYES: Pupils equal, round, reactive to light and accommodation. No scleral icterus. Extraocular muscles intact.  HEENT: Head atraumatic, normocephalic. Oropharynx and nasopharynx clear.  NECK:  Supple, no jugular venous distention. No thyroid enlargement, no tenderness.  LUNGS: Poor Resp. effort, no wheezing, rales, rhonchi. No use of accessory muscles of respiration.  CARDIOVASCULAR: S1, S2 normal. No murmurs, rubs, or gallops.  ABDOMEN: Soft, nontender, nondistended. Bowel sounds present. No organomegaly or mass.  EXTREMITIES: No cyanosis, clubbing or edema b/l.    NEUROLOGIC: Cranial nerves II through XII are intact. No focal Motor or sensory deficits b/l. Globally weak   PSYCHIATRIC: The patient is alert and oriented x 1. SKIN: No obvious rash, lesion, or ulcer.    LABORATORY PANEL:   CBC Recent Labs    Lab 02/14/18 0556  WBC 13.1*  HGB 12.8  HCT 42.6  PLT 271   ------------------------------------------------------------------------------------------------------------------  Chemistries  Recent Labs  Lab 02/13/18 1748 02/14/18 0556  NA 147* 148*  K 4.2 3.5  CL 108 113*  CO2 29 27  GLUCOSE 305* 154*  BUN 31* 23  CREATININE 0.72 0.48  CALCIUM 9.8 8.6*  AST 20  --   ALT 25  --   ALKPHOS 114  --   BILITOT 0.3  --    ------------------------------------------------------------------------------------------------------------------  Cardiac Enzymes Recent Labs  Lab 02/13/18 1748  TROPONINI 0.03*   ------------------------------------------------------------------------------------------------------------------  RADIOLOGY:  Dg Chest 2 View  Result Date: 02/15/2018 CLINICAL DATA:  Pneumonia, sepsis EXAM: CHEST - 2 VIEW COMPARISON:  02/13/2018 FINDINGS: Stable cardiomegaly with basilar chronic interstitial prominence. Previous coronary bypass changes noted. Aorta is atherosclerotic. No superimposed acute pneumonia, collapse, consolidation, edema, effusion or pneumothorax. Trachea is midline. Aorta atherosclerotic. Peripherally calcified breast implants noted. Bones are osteopenic with degenerative changes of the spine and shoulders. IMPRESSION: Stable cardiomegaly and chronic basilar interstitial changes. No interval change or acute process. Electronically Signed   By: Judie Petit.  Shick M.D.   On: 02/15/2018 09:05   Ct Head Wo Contrast  Result Date: 02/13/2018 CLINICAL DATA:  Tachycardia, hypertension and fever. EXAM: CT HEAD WITHOUT CONTRAST TECHNIQUE: Contiguous axial images were obtained from the base of the skull through the vertex without intravenous contrast. COMPARISON:  09/18/2013 FINDINGS: Brain: No evidence of acute infarction, hemorrhage, hydrocephalus, extra-axial collection or mass lesion/mass effect. Moderate brain parenchymal volume  loss and deep white matter  microangiopathy. Vascular: Calcific atherosclerotic disease of the intra cavernous carotid arteries. Skull: Normal. Negative for fracture or focal lesion. Sinuses/Orbits: No acute finding. Other: None. IMPRESSION: No acute intracranial abnormality. Moderate brain parenchymal volume loss chronic ischemic microvascular disease. Electronically Signed   By: Ted Mcalpine M.D.   On: 02/13/2018 18:16   Ct Chest Wo Contrast  Result Date: 02/13/2018 CLINICAL DATA:  Hypertension.  Tachycardia.  Fever. EXAM: CT CHEST WITHOUT CONTRAST TECHNIQUE: Multidetector CT imaging of the chest was performed following the standard protocol without IV contrast. COMPARISON:  Chest radiography same day.  Chest CT 10/30/2012. FINDINGS: Cardiovascular: Borderline cardiomegaly. Previous median sternotomy and CABG. Extensive atherosclerotic calcification of the aorta and the brachiocephalic vessels. Mediastinum/Nodes: Chronically prominent paratracheal and precarinal lymph nodes, similar to the study of 2014 and therefore benign. Lungs/Pleura: Background pattern of emphysema and pulmonary scarring. No pleural effusion. No sign of pneumonia or collapse. New somewhat spiculated 1 cm density in the medial posterior right upper lobe which is of some concern, malignancy not excluded. Extensive calcification of the tracheobronchial cartilage. Upper Abdomen: Negative Musculoskeletal: Previous breast implants with calcifications. No significant spinal finding. IMPRESSION: No evidence of pneumonia. Background pulmonary scarring and mild emphysema. Chronic slight prominence of the mediastinal lymph nodes, unchanged since 2014. Newly seen 1 cm spiculated density in the posteromedial right upper lobe. Malignancy not excluded. Further evaluation recommended. Aortic atherosclerosis.  Previous CABG. Electronically Signed   By: Paulina Fusi M.D.   On: 02/13/2018 19:43   Dg Chest Port 1 View  Result Date: 02/13/2018 CLINICAL DATA:  Tachycardia  and altered mental status. EXAM: PORTABLE CHEST 1 VIEW COMPARISON:  02/17/2016 FINDINGS: Stable cardiomegaly with moderate aortic atherosclerosis and post CABG change. Chronic interstitial prominence of the lungs without alveolar consolidation, CHF, effusion or pneumothorax. Remote fracture deformity of the distal right clavicle. Soft tissue calcifications adjacent to the left humeral head are redemonstrated and may reflect calcific rotator cuff tendinopathy or bursitis. Remote bilateral rib fractures. Calcified bilateral breast implants are again noted. IMPRESSION: Stable cardiomegaly with moderate aortic atherosclerosis. Chronic interstitial prominence of the lungs without acute pulmonary disease. Other ancillary findings as above. Electronically Signed   By: Tollie Eth M.D.   On: 02/13/2018 18:16     ASSESSMENT AND PLAN:   83 year old female with past medical history of dementia, hypertension, hyperlipidemia, diabetes, COPD, coronary artery disease who presented to the hospital due to fever, tachypnea, tachycardia.  1.  Sepsis- patient presented to the hospital due to suspected sepsis given the above symptoms. - Source was thought to be pulmonary in nature.  Initially patient was on broad-spectrum IV antibiotics with vancomycin, meropenem but currently just on IV ceftriaxone.  Blood cultures are consistent with contamination. - Currently afebrile and hemodynamically stable. -Sepsis has now been ruled out.  2.  Essential hypertension-continue Norvasc.  3.  Diabetes type 2 without complication-continue sliding scale insulin.  4.  Depression-continue Effexor.  5.  Dementia-patient is total care, daughter takes care of her at home and she wants the patient to go home when stable.   All the records are reviewed and case discussed with Care Management/Social Worker. Management plans discussed with the patient, family and they are in agreement.  CODE STATUS: Full code  DVT Prophylaxis:  Lovenox  TOTAL TIME TAKING CARE OF THIS PATIENT: 30 minutes.   POSSIBLE D/C IN 1-2 DAYS, DEPENDING ON CLINICAL CONDITION.   Houston Siren M.D on 02/15/2018 at 3:18 PM  Between 7am to  6pm - Pager - [phone]  After 6pm go to [email or web address] - Social research officer, government  [employer] [facility]  Office  [phone]  CC: Primary care physician; Madelon Lips, MD

## 2018-02-15 NOTE — Progress Notes (Signed)
Speech Language Pathology Treatment: Dysphagia  Kimberly Rodgers Details Name: Kimberly Rodgers MRN: [id number] DOB: 07-05-1934 Today's Date: 02/15/2018 Time: 7425-9563 SLP Time Calculation (min) (ACUTE ONLY): 42 min  Assessment / Plan / Recommendation Clinical Impression  Pt seen for ongoing assessment of toleration of recommended oral diet. Pt had a BSE yesterday w/ noted oropharyngeal phase dysphagia and risk for aspiration at this time d/t presenting Cognitive decline. Pt is awake this morning, no verbal responses but she did respond to tactile stim of spoon at lips to take boluses w/ SLP.  Pt consumed trials of Nectar consistency liquids via TSP and cup w/ no immediate, overt s/s of aspiration noted. She accepted the trials each time not appearing to have any negative response to the Nectar consistency liquids. Pt exhibited no decline in respiratory status during/post trials; unable to assess vocal quality d/t nonverbal status. This was noted w/ trials of puree as well. However, min oral phase deficits were apparent w/ the trials of puree and Nectar liquids c/b increased oral phase time and lingual sweeping and pauses b/t trials. Alternating foods/liquids appeared to aid oral clearing; no pocketing or oral holding noted. Pt required FULL feeding assistance d/t the Cognitive decline.  Recommend continue a Dysphagia level 1 w/ Nectar liquids diet at time of discharge d/t pt's baseline Dementia per MD and risk for aspiration d/t dysphagia; aspiration precautions; feeding support at all meals. Recommend Pills Crushed in Puree for safer swallowing and oral clearing. ST services will continue to monitor while admitted. Such can be ordered at discharge as needed. NSG updated, agreed.      HPI HPI: Pt  is a 83 y.o. female who presents with chief complaint fever, tachycardia, HTN.  Kimberly Rodgers nonverbal at this time, all information obtained from one of Kimberly Rodgers's daughter who is bedside.  Kimberly Rodgers lives with other  daughter normally, but was placed in [facility] this past Sunday as daughter is out of town.  Daughter reports that last Monday the Kimberly Rodgers started to become more lethargic, with a congested cough, clear nasal drainage, requiring supplemental oxygen- which is not baseline, and that she noticed her heart rate and blood pressure were elevated.  The SNF staff told the daughter that pneumonia had been ruled out with an x-ray, and began treating her for a COPD exacerbation with Azithromycin and Prednisone since Tuesday.  Daughter reports that the Kimberly Rodgers continued to slowly decline and was brought in tonight due to tachycardia, hypertension and fever.  Kimberly Rodgers met sepsis criteria and the Sepsis work-up was completed in ED, Hospitalist was called for admission.  Chest X-ray and CT show a LLL Pneumonia.  Pt is mostly nonverbal and not following any commands.  MRI revealed Moderate brain parenchymal volume loss chronic ischemic disease.       SLP Plan  Continue with current plan of care       Recommendations  Diet recommendations: Dysphagia 1 (puree);Nectar-thick liquid Liquids provided via: Teaspoon;Cup;Straw(monitor tolerance of) Medication Administration: Crushed with puree(for safer swallowing and oral clearing) Supervision: Staff to assist with self feeding;Full supervision/cueing for compensatory strategies Compensations: Minimize environmental distractions;Slow rate;Small sips/bites;Lingual sweep for clearance of pocketing;Multiple dry swallows after each bite/sip;Follow solids with liquid Postural Changes and/or Swallow Maneuvers: Seated upright 90 degrees;Upright 30-60 min after meal                General recommendations: (Dietician f/u) Oral Care Recommendations: Oral care BID;Staff/trained caregiver to provide oral care Follow up Recommendations: Skilled Nursing facility(vs Home w/ family) SLP  Visit Diagnosis: Dysphagia, oropharyngeal phase (R13.12) Plan: Continue with current plan  of care       [place], [place], CCC-SLP Watson,Katherine 02/15/2018, 11:29 AM

## 2018-02-16 LAB — GLUCOSE, CAPILLARY
GLUCOSE-CAPILLARY: 173 mg/dL — AB (ref 70–99)
GLUCOSE-CAPILLARY: 206 mg/dL — AB (ref 70–99)

## 2018-02-16 LAB — CBC
HCT: 43.7 % (ref 36.0–46.0)
Hemoglobin: 13.3 g/dL (ref 12.0–15.0)
MCH: 26.8 pg (ref 26.0–34.0)
MCHC: 30.4 g/dL (ref 30.0–36.0)
MCV: 87.9 fL (ref 80.0–100.0)
NRBC: 0 % (ref 0.0–0.2)
PLATELETS: 266 10*3/uL (ref 150–400)
RBC: 4.97 MIL/uL (ref 3.87–5.11)
RDW: 14.2 % (ref 11.5–15.5)
WBC: 10.8 10*3/uL — AB (ref 4.0–10.5)

## 2018-02-16 LAB — BASIC METABOLIC PANEL
ANION GAP: 8 (ref 5–15)
BUN: 18 mg/dL (ref 8–23)
CALCIUM: 9.1 mg/dL (ref 8.9–10.3)
CO2: 30 mmol/L (ref 22–32)
Chloride: 110 mmol/L (ref 98–111)
Creatinine, Ser: 0.43 mg/dL — ABNORMAL LOW (ref 0.44–1.00)
Glucose, Bld: 117 mg/dL — ABNORMAL HIGH (ref 70–99)
Potassium: 3.4 mmol/L — ABNORMAL LOW (ref 3.5–5.1)
SODIUM: 148 mmol/L — AB (ref 135–145)

## 2018-02-16 MED ORDER — CEFDINIR 250 MG/5ML PO SUSR: 300.0000 mg | Freq: Two times a day (BID) | ORAL | 0 refills | Status: AC

## 2018-02-16 MED ORDER — AMLODIPINE BESYLATE 5 MG PO TABS: 5.0000 mg | ORAL_TABLET | Freq: Every day | ORAL | 1 refills | Status: AC

## 2018-02-16 NOTE — Progress Notes (Signed)
Discharge instruction given to daughter. Pt. O2 sats was 92 RA. VS stable. No other issues noted.

## 2018-02-16 NOTE — Care Management (Signed)
Previous RNCM had consulted regarding home health. RNCM spoke with daughter today who thinks there is no need for home health. Daughter takes care of the patient all day and has multiple DME such as hospital bed, lift chair,etc. Family feels like they have a safe discharge disposition and have no needs. Family will transport. Patient no longer requiring acute O2.

## 2018-02-16 NOTE — Discharge Summary (Addendum)
[facility] [place] [facility]   PATIENT NAME: Kimberly Rodgers    MR#:  [id number]  DATE OF BIRTH:  30-Sep-1934  DATE OF ADMISSION:  02/13/2018 ADMITTING PHYSICIAN: Oralia Manis, MD  DATE OF DISCHARGE: 02/16/2018  PRIMARY CARE PHYSICIAN: Madelon Lips, MD    ADMISSION DIAGNOSIS:  Fever, unspecified fever cause [R50.9] Altered mental status, unspecified altered mental status type [R41.82] Sepsis, due to unspecified organism, unspecified whether acute organ dysfunction present (HCC) [A41.9]  DISCHARGE DIAGNOSIS:  Principal Problem:   Sepsis (HCC) Active Problems:   CAD (coronary artery disease) of artery bypass graft   Diabetes type 2, controlled (HCC)   COPD (chronic obstructive pulmonary disease) with emphysema (HCC)   Pneumonia   Pressure injury of skin   SECONDARY DIAGNOSIS:   Past Medical History:  Diagnosis Date  . COPD (chronic obstructive pulmonary disease) (HCC)   . Coronary artery disease   . Diabetes mellitus without complication (HCC)   . Hyperlipidemia   . Hypertension   . Hypoxemia   . Memory loss   . Personal history of fall     HOSPITAL COURSE:   83 year old female with past medical history of dementia, hypertension, hyperlipidemia, diabetes, COPD, coronary artery disease who presented to the hospital due to fever, tachypnea, tachycardia.  1.  Sepsis- patient presented to the hospital due to suspected sepsis given the above symptoms. - Source was thought to be pulmonary in nature.  Initially patient was on broad-spectrum IV antibiotics with vancomycin, meropenem but then weaned to just Ceftriaxone.  Blood cultures were consistent with contamination. -Patient is presently afebrile and hemodynamically stable and empirically being discharged on oral Cefdinir suspension.  -Sepsis has now been ruled out.  2.  Essential hypertension- pt. Will continue Norvasc.  3.  Diabetes type 2 without complication- while the patient was on  SSI but will resume her Metformin upon discharge.   4.  Depression- pt. Will continue Effexor.  While in the hospital patient was seen by speech therapy and placed on a pured diet with nectar thick liquids, although the daughter does not want to feed the patient this diet as she normally is able to eat regular food when she is at home.  The daughter would prefer to continue her on her soft diet at home.  She has total care and daughter would prefer to take the patient home.  Daughter is okay for patient's discharge home today on oral antibiotics.  DISCHARGE CONDITIONS:   Stable.   CONSULTS OBTAINED:    DRUG ALLERGIES:   Allergies  Allergen Reactions  . Codeine Other (See Comments)    Reaction: unknown  . Penicillins Other (See Comments)    Reaction: unknown  . Pregabalin Other (See Comments)    Reaction: unknown  . Wellbutrin [Bupropion] Other (See Comments)    Reaction: unknown    DISCHARGE MEDICATIONS:   Allergies as of 02/16/2018      Reactions   Codeine Other (See Comments)   Reaction: unknown   Penicillins Other (See Comments)   Reaction: unknown   Pregabalin Other (See Comments)   Reaction: unknown   Wellbutrin [bupropion] Other (See Comments)   Reaction: unknown      Medication List    STOP taking these medications   albuterol 108 (90 Base) MCG/ACT inhaler Commonly known as:  PROVENTIL HFA;VENTOLIN HFA     TAKE these medications   amLODipine 5 MG tablet Commonly known as:  NORVASC Take 1 tablet (5 mg total) by mouth  daily.   bisacodyl 10 MG suppository Commonly known as:  DULCOLAX Place 10 mg rectally every 3 (three) days.   cefdinir 250 MG/5ML suspension Commonly known as:  OMNICEF Take 6 mLs (300 mg total) by mouth 2 (two) times daily for 5 days.   docusate sodium 100 MG capsule Commonly known as:  COLACE Take 100 mg by mouth daily.   metFORMIN 500 MG tablet Commonly known as:  GLUCOPHAGE Take 500 mg by mouth daily with breakfast.    traZODone 100 MG tablet Commonly known as:  DESYREL Take 100 mg by mouth at bedtime.   venlafaxine XR 37.5 MG 24 hr capsule Commonly known as:  EFFEXOR-XR Take 37.5 mg by mouth daily with breakfast.         DISCHARGE INSTRUCTIONS:   DIET:  Regular diet  DISCHARGE CONDITION:  Stable  ACTIVITY:  Bedrest  OXYGEN:  Home Oxygen: No.   Oxygen Delivery: room air  DISCHARGE LOCATION:  home   If you experience worsening of your admission symptoms, develop shortness of breath, life threatening emergency, suicidal or homicidal thoughts you must seek medical attention immediately by calling 911 or calling your MD immediately  if symptoms less severe.  You Must read complete instructions/literature along with all the possible adverse reactions/side effects for all the Medicines you take and that have been prescribed to you. Take any new Medicines after you have completely understood and accpet all the possible adverse reactions/side effects.   Please note  You were cared for by a hospitalist during your hospital stay. If you have any questions about your discharge medications or the care you received while you were in the hospital after you are discharged, you can call the unit and asked to speak with the hospitalist on call if the hospitalist that took care of you is not available. Once you are discharged, your primary care physician will handle any further medical issues. Please note that NO REFILLS for any discharge medications will be authorized once you are discharged, as it is imperative that you return to your primary care physician (or establish a relationship with a primary care physician if you do not have one) for your aftercare needs so that they can reassess your need for medications and monitor your lab values.     Today   Afebrile, hemodynamically stable.  Mental status is at baseline as per the family.  Will discharge home today and patient's daughter is okay with  plan.  VITAL SIGNS:  Blood pressure (!) 164/73, pulse (!) 101, temperature 98.6 F (37 C), temperature source Oral, resp. rate 18, height 5\' 1"  (1.549 m), weight 51.4 kg, SpO2 97 %.  I/O:    Intake/Output Summary (Last 24 hours) at 02/16/2018 1521 Last data filed at 02/16/2018 0500 Gross per 24 hour  Intake 0 ml  Output 50 ml  Net -50 ml    PHYSICAL EXAMINATION:   GENERAL:  83 y.o.-year-old demented non-verbal patient lying in bed in no acute distress.  EYES: Pupils equal, round, reactive to light and accommodation. No scleral icterus. Extraocular muscles intact.  HEENT: Head atraumatic, normocephalic. Oropharynx and nasopharynx clear.  NECK:  Supple, no jugular venous distention. No thyroid enlargement, no tenderness.  LUNGS: Poor Resp. effort, no wheezing, rales, rhonchi. No use of accessory muscles of respiration.  CARDIOVASCULAR: S1, S2 normal. No murmurs, rubs, or gallops.  ABDOMEN: Soft, nontender, nondistended. Bowel sounds present. No organomegaly or mass.  EXTREMITIES: No cyanosis, clubbing or edema b/l.    NEUROLOGIC:  Cranial nerves II through XII are intact. No focal Motor or sensory deficits b/l. Globally weak   PSYCHIATRIC: The patient is alert and oriented x 1. SKIN: No obvious rash, lesion, or ulcer.   DATA REVIEW:   CBC Recent Labs  Lab 02/16/18 0401  WBC 10.8*  HGB 13.3  HCT 43.7  PLT 266    Chemistries  Recent Labs  Lab 02/13/18 1748  02/16/18 0401  NA 147*   < > 148*  K 4.2   < > 3.4*  CL 108   < > 110  CO2 29   < > 30  GLUCOSE 305*   < > 117*  BUN 31*   < > 18  CREATININE 0.72   < > 0.43*  CALCIUM 9.8   < > 9.1  AST 20  --   --   ALT 25  --   --   ALKPHOS 114  --   --   BILITOT 0.3  --   --    < > = values in this interval not displayed.    Cardiac Enzymes Recent Labs  Lab 02/13/18 1748  TROPONINI 0.03*    Microbiology Results  Results for orders placed or performed during the hospital encounter of 02/13/18  Blood Culture  (routine x 2)     Status: None (Preliminary result)   Collection Time: 02/13/18  5:48 PM  Result Value Ref Range Status   Specimen Description BLOOD BLOOD RIGHT ARM  Final   Special Requests   Final    BOTTLES DRAWN AEROBIC AND ANAEROBIC Blood Culture adequate volume   Culture   Final    NO GROWTH 3 DAYS Performed at [facility], [address] [place], [place] [address]    Report Status PENDING  Incomplete  Urine Culture     Status: None   Collection Time: 02/13/18  5:54 PM  Result Value Ref Range Status   Specimen Description   Final    URINE, RANDOM Performed at [facility], [address], [place], [place] [address]    Special Requests   Final    NONE Performed at [facility], [address] [place], [place] [address]    Culture   Final    NO GROWTH Performed at [facility], [address], [place], [place] [address]    Report Status 02/15/2018 FINAL  Final  Blood Culture (routine x 2)     Status: Abnormal   Collection Time: 02/13/18  5:55 PM  Result Value Ref Range Status   Specimen Description   Final    BLOOD BLOOD RIGHT ARM Performed at [facility], [address] [place], [place] [address]    Special Requests   Final    BOTTLES DRAWN AEROBIC AND ANAEROBIC Blood Culture adequate volume Performed at [facility], [address] [place], [place] [address]    Culture  Setup Time   Final    AEROBIC BOTTLE ONLY GRAM POSITIVE COCCI CRITICAL RESULT CALLED TO, READ BACK BY AND VERIFIED WITH: Crist Fat AT 1229 ON 02/14/2018 JJB    Culture (A)  Final    STAPHYLOCOCCUS SPECIES (COAGULASE NEGATIVE) THE SIGNIFICANCE OF ISOLATING THIS ORGANISM FROM A SINGLE SET OF BLOOD CULTURES WHEN MULTIPLE SETS ARE DRAWN IS UNCERTAIN. PLEASE NOTIFY THE MICROBIOLOGY DEPARTMENT WITHIN ONE WEEK IF SPECIATION AND SENSITIVITIES ARE REQUIRED. Performed at [facility], [address], [place], [place] [address]    Report  Status 02/15/2018  FINAL  Final  Blood Culture ID Panel (Reflexed)     Status: Abnormal   Collection Time: 02/13/18  5:55 PM  Result Value Ref Range Status   Enterococcus species NOT DETECTED NOT DETECTED Final   Listeria monocytogenes NOT DETECTED NOT DETECTED Final   Staphylococcus species DETECTED (A) NOT DETECTED Final    Comment: Methicillin (oxacillin) susceptible coagulase negative staphylococcus. Possible blood culture contaminant (unless isolated from more than one blood culture draw or clinical case suggests pathogenicity). No antibiotic treatment is indicated for blood  culture contaminants. CRITICAL RESULT CALLED TO, READ BACK BY AND VERIFIED WITH: Crist Fat AT 1229 ON 02/14/2018 JJB    Staphylococcus aureus (BCID) NOT DETECTED NOT DETECTED Final   Methicillin resistance NOT DETECTED NOT DETECTED Final   Streptococcus species NOT DETECTED NOT DETECTED Final   Streptococcus agalactiae NOT DETECTED NOT DETECTED Final   Streptococcus pneumoniae NOT DETECTED NOT DETECTED Final   Streptococcus pyogenes NOT DETECTED NOT DETECTED Final   Acinetobacter baumannii NOT DETECTED NOT DETECTED Final   Enterobacteriaceae species NOT DETECTED NOT DETECTED Final   Enterobacter cloacae complex NOT DETECTED NOT DETECTED Final   Escherichia coli NOT DETECTED NOT DETECTED Final   Klebsiella oxytoca NOT DETECTED NOT DETECTED Final   Klebsiella pneumoniae NOT DETECTED NOT DETECTED Final   Proteus species NOT DETECTED NOT DETECTED Final   Serratia marcescens NOT DETECTED NOT DETECTED Final   Haemophilus influenzae NOT DETECTED NOT DETECTED Final   Neisseria meningitidis NOT DETECTED NOT DETECTED Final   Pseudomonas aeruginosa NOT DETECTED NOT DETECTED Final   Candida albicans NOT DETECTED NOT DETECTED Final   Candida glabrata NOT DETECTED NOT DETECTED Final   Candida krusei NOT DETECTED NOT DETECTED Final   Candida parapsilosis NOT DETECTED NOT DETECTED Final   Candida tropicalis NOT DETECTED  NOT DETECTED Final    Comment: Performed at [facility], [address] [place], [place] [address]  MRSA PCR Screening     Status: None   Collection Time: 02/14/18  3:40 AM  Result Value Ref Range Status   MRSA by PCR NEGATIVE NEGATIVE Final    Comment:        The GeneXpert MRSA Assay (FDA approved for NASAL specimens only), is one component of a comprehensive MRSA colonization surveillance program. It is not intended to diagnose MRSA infection nor to guide or monitor treatment for MRSA infections. Performed at [facility], [address] [place] [place], [place] [address]     RADIOLOGY:  Dg Chest 2 View  Result Date: 02/15/2018 CLINICAL DATA:  Pneumonia, sepsis EXAM: CHEST - 2 VIEW COMPARISON:  02/13/2018 FINDINGS: Stable cardiomegaly with basilar chronic interstitial prominence. Previous coronary bypass changes noted. Aorta is atherosclerotic. No superimposed acute pneumonia, collapse, consolidation, edema, effusion or pneumothorax. Trachea is midline. Aorta atherosclerotic. Peripherally calcified breast implants noted. Bones are osteopenic with degenerative changes of the spine and shoulders. IMPRESSION: Stable cardiomegaly and chronic basilar interstitial changes. No interval change or acute process. Electronically Signed   By: Judie Petit.  Shick M.D.   On: 02/15/2018 09:05      Management plans discussed with the patient, family and they are in agreement.  CODE STATUS:     Code Status Orders  (From admission, onward)         Start     Ordered   02/14/18 0005  Full code  Continuous     02/14/18 0004          TOTAL TIME TAKING CARE OF THIS PATIENT: 40  minutes.    Houston Siren M.D on 02/16/2018 at 3:21 PM  Between 7am to 6pm - Pager - [phone]  After 6pm go to [email or web address] - Social research officer, government  [facility]  Office  [phone]  CC: Primary care physician; Madelon Lips, MD

## 2018-02-18 LAB — CULTURE, BLOOD (ROUTINE X 2)
Culture: NO GROWTH
Special Requests: ADEQUATE

## 2018-02-20 ENCOUNTER — Telehealth: Payer: Self-pay

## 2018-02-20 NOTE — Telephone Encounter (Signed)
Flagged on EMMI report for not knowing who to call about changes in condition, not having a follow up scheduled, having unfilled prescriptions, and having other questions or problems.  First attempt to reach patient made 02/20/18 at 1:06pm, however unable to reach patient.  Left voicemail encouraging callback. Will attempt at later time.

## 2018-02-21 NOTE — Telephone Encounter (Signed)
Second attempt made to reach patient, however unable to reach.  Left another voicemail encouraging callback for any questions or concerns.

## 2019-08-29 DEATH — deceased
